# Patient Record
Sex: Male | Born: 1950 | Race: White | Marital: Married | Smoking: Never smoker
Health system: Southern US, Community
[De-identification: ages and names within clinical notes are randomized; demographics above are authoritative.]

## PROBLEM LIST (undated history)

## (undated) DIAGNOSIS — Z789 Other specified health status: Secondary | ICD-10-CM

---

## 2004-04-29 HISTORY — PX: INCISION AND DRAINAGE OF WOUND: SHX1803

## 2011-09-24 ENCOUNTER — Ambulatory Visit: Payer: Self-pay | Admitting: Internal Medicine

## 2015-01-04 ENCOUNTER — Ambulatory Visit: Payer: 59

## 2015-01-04 DIAGNOSIS — Z23 Encounter for immunization: Secondary | ICD-10-CM

## 2015-01-09 ENCOUNTER — Encounter: Payer: Self-pay | Admitting: Family Medicine

## 2015-01-09 ENCOUNTER — Ambulatory Visit
Admission: RE | Admit: 2015-01-09 | Discharge: 2015-01-09 | Disposition: A | Discharge status: Home / Self Care | Payer: 59 | Source: Ambulatory Visit | Attending: Family Medicine | Admitting: Family Medicine

## 2015-01-09 ENCOUNTER — Ambulatory Visit (INDEPENDENT_AMBULATORY_CARE_PROVIDER_SITE_OTHER): Payer: 59 | Admitting: Family Medicine

## 2015-01-09 VITALS — BP 118/78 | HR 79 | Temp 98.1°F | Resp 16 | Ht 62.0 in | Wt 177.0 lb

## 2015-01-09 DIAGNOSIS — M5090 Cervical disc disorder, unspecified, unspecified cervical region: Secondary | ICD-10-CM

## 2015-01-09 DIAGNOSIS — M62838 Other muscle spasm: Secondary | ICD-10-CM

## 2015-01-09 DIAGNOSIS — M6249 Contracture of muscle, multiple sites: Secondary | ICD-10-CM | POA: Diagnosis not present

## 2015-01-09 MED ORDER — MELOXICAM 15 MG PO TABS: 15.0000 mg | ORAL_TABLET | Freq: Every day | ORAL | Status: DC

## 2015-01-09 MED ORDER — CYCLOBENZAPRINE HCL 5 MG PO TABS: 5.0000 mg | ORAL_TABLET | Freq: Two times a day (BID) | ORAL | Status: DC | PRN

## 2015-01-10 LAB — COMPREHENSIVE METABOLIC PANEL
ALBUMIN: 4.9 g/dL — AB (ref 3.6–4.8)
ALT: 19 IU/L (ref 0–44)
AST: 25 IU/L (ref 0–40)
Albumin/Globulin Ratio: 1.8 (ref 1.1–2.5)
Alkaline Phosphatase: 73 IU/L (ref 39–117)
BUN / CREAT RATIO: 13 (ref 10–22)
BUN: 12 mg/dL (ref 8–27)
Bilirubin Total: 0.6 mg/dL (ref 0.0–1.2)
CALCIUM: 9.9 mg/dL (ref 8.6–10.2)
CO2: 25 mmol/L (ref 18–29)
CREATININE: 0.9 mg/dL (ref 0.76–1.27)
Chloride: 99 mmol/L (ref 97–108)
GFR, EST AFRICAN AMERICAN: 105 mL/min/{1.73_m2} (ref >59)
GFR, EST NON AFRICAN AMERICAN: 91 mL/min/{1.73_m2} (ref >59)
GLOBULIN, TOTAL: 2.8 g/dL (ref 1.5–4.5)
Glucose: 90 mg/dL (ref 65–99)
Potassium: 4.5 mmol/L (ref 3.5–5.2)
SODIUM: 140 mmol/L (ref 134–144)
TOTAL PROTEIN: 7.7 g/dL (ref 6.0–8.5)

## 2015-01-13 NOTE — Patient Instructions (Signed)
Consider adding gabapentin to his regimen and return visit
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
## 2015-01-17 ENCOUNTER — Ambulatory Visit: Payer: 59 | Admitting: Physical Therapy

## 2015-01-18 ENCOUNTER — Encounter: Payer: Self-pay | Admitting: Family Medicine

## 2015-01-23 ENCOUNTER — Ambulatory Visit: Payer: 59 | Attending: Family Medicine | Admitting: Physical Therapy

## 2015-01-23 DIAGNOSIS — M542 Cervicalgia: Secondary | ICD-10-CM

## 2015-01-23 NOTE — Patient Instructions (Signed)
All exercises provided were adapted from [email or web address]. Patient was provided a written handout with pictures as described. Any additional cues were manually entered in to handout and copied in to this document.  Seated Thoracic Extension  Sit with your back against the top of a chair or with foam roll/noodle behind and perpendicular to your spine. Support your neck with your hands and tuck elbows forward and together. Gently arch back to mobilize spine at level where chair contacts spine. Hold 1-2 seconds then slowly return. Repeat. Repeat at 3 separate levels throughout mid-back, starting down near bottom of ribs and moving up toward top of shoulder blades.    CERVICAL FLEXION  Tilt your head downwards, then return back to looking straight ahead.   Neck - Levator Scapula Stretch  Sit in a chair and hold onto the underside of the seat with one hand Rotate your head to the opposite side of the hand that is under your chair Take your opposite hand and pull your head and nose toward your arm pit
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
## 2015-01-24 ENCOUNTER — Encounter: Payer: 59 | Admitting: Physical Therapy

## 2015-01-27 ENCOUNTER — Ambulatory Visit: Payer: 59 | Admitting: Physical Therapy

## 2015-01-27 DIAGNOSIS — M542 Cervicalgia: Secondary | ICD-10-CM
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
## 2015-01-30 ENCOUNTER — Ambulatory Visit: Payer: 59 | Admitting: Physical Therapy

## 2015-02-01 ENCOUNTER — Encounter: Payer: 59 | Admitting: Physical Therapy

## 2015-02-03 ENCOUNTER — Ambulatory Visit: Payer: 59 | Admitting: Physical Therapy

## 2015-02-03 ENCOUNTER — Encounter: Payer: 59 | Admitting: Physical Therapy

## 2015-02-06 ENCOUNTER — Ambulatory Visit: Payer: 59 | Attending: Family Medicine | Admitting: Physical Therapy

## 2015-02-07 ENCOUNTER — Ambulatory Visit: Payer: 59 | Admitting: Family Medicine

## 2015-02-13 ENCOUNTER — Ambulatory Visit: Payer: 59 | Admitting: Physical Therapy

## 2015-02-17 ENCOUNTER — Encounter: Payer: 59 | Admitting: Physical Therapy

## 2015-02-20 ENCOUNTER — Emergency Department
Admission: EM | Admit: 2015-02-20 | Discharge: 2015-02-20 | Disposition: A | Discharge status: Home / Self Care | Payer: 59 | Attending: Emergency Medicine | Admitting: Emergency Medicine

## 2015-02-20 ENCOUNTER — Encounter: Payer: Self-pay | Admitting: Emergency Medicine

## 2015-02-20 ENCOUNTER — Emergency Department: Payer: 59

## 2015-02-20 ENCOUNTER — Encounter: Payer: 59 | Admitting: Physical Therapy

## 2015-02-20 DIAGNOSIS — L0291 Cutaneous abscess, unspecified: Secondary | ICD-10-CM

## 2015-02-20 DIAGNOSIS — L03115 Cellulitis of right lower limb: Secondary | ICD-10-CM | POA: Diagnosis not present

## 2015-02-20 DIAGNOSIS — L02611 Cutaneous abscess of right foot: Secondary | ICD-10-CM | POA: Insufficient documentation

## 2015-02-20 DIAGNOSIS — M79671 Pain in right foot: Secondary | ICD-10-CM | POA: Diagnosis present

## 2015-02-20 LAB — COMPREHENSIVE METABOLIC PANEL
ALT: 20 U/L (ref 17–63)
AST: 27 U/L (ref 15–41)
Albumin: 4.5 g/dL (ref 3.5–5.0)
Alkaline Phosphatase: 77 U/L (ref 38–126)
Anion gap: 7 (ref 5–15)
BUN: 14 mg/dL (ref 6–20)
CHLORIDE: 106 mmol/L (ref 101–111)
CO2: 25 mmol/L (ref 22–32)
Calcium: 9.9 mg/dL (ref 8.9–10.3)
Creatinine, Ser: 1.09 mg/dL (ref 0.61–1.24)
Glucose, Bld: 120 mg/dL — ABNORMAL HIGH (ref 65–99)
POTASSIUM: 3.9 mmol/L (ref 3.5–5.1)
SODIUM: 138 mmol/L (ref 135–145)
Total Bilirubin: 0.8 mg/dL (ref 0.3–1.2)
Total Protein: 8.2 g/dL — ABNORMAL HIGH (ref 6.5–8.1)

## 2015-02-20 LAB — CBC WITH DIFFERENTIAL/PLATELET
BASOS ABS: 0 10*3/uL (ref 0–0.1)
Basophils Relative: 0 %
EOS ABS: 0.1 10*3/uL (ref 0–0.7)
EOS PCT: 2 %
HCT: 42.6 % (ref 40.0–52.0)
Hemoglobin: 14.4 g/dL (ref 13.0–18.0)
LYMPHS ABS: 1.1 10*3/uL (ref 1.0–3.6)
LYMPHS PCT: 13 %
MCH: 30.7 pg (ref 26.0–34.0)
MCHC: 33.8 g/dL (ref 32.0–36.0)
MCV: 90.9 fL (ref 80.0–100.0)
MONO ABS: 0.8 10*3/uL (ref 0.2–1.0)
Monocytes Relative: 9 %
Neutro Abs: 6.6 10*3/uL — ABNORMAL HIGH (ref 1.4–6.5)
Neutrophils Relative %: 76 %
PLATELETS: 202 10*3/uL (ref 150–440)
RBC: 4.69 MIL/uL (ref 4.40–5.90)
RDW: 12.3 % (ref 11.5–14.5)
WBC: 8.7 10*3/uL (ref 3.8–10.6)

## 2015-02-20 MED ORDER — MORPHINE SULFATE (PF) 4 MG/ML IV SOLN
4.0000 mg | Freq: Once | INTRAVENOUS | Status: AC
  Administered 2015-02-20: 4 mg via INTRAVENOUS
  Filled 2015-02-20: qty 1

## 2015-02-20 MED ORDER — PIPERACILLIN-TAZOBACTAM 3.375 G IVPB
3.3750 g | Freq: Once | INTRAVENOUS | Status: AC
  Administered 2015-02-20: 3.375 g via INTRAVENOUS
  Filled 2015-02-20: qty 50

## 2015-02-20 MED ORDER — OXYCODONE-ACETAMINOPHEN 5-325 MG PO TABS: 2.0000 | ORAL_TABLET | Freq: Four times a day (QID) | ORAL | Status: DC | PRN

## 2015-02-20 MED ORDER — IOHEXOL 300 MG/ML  SOLN
100.0000 mL | Freq: Once | INTRAMUSCULAR | Status: AC | PRN
  Administered 2015-02-20: 100 mL via INTRAVENOUS

## 2015-02-20 MED ORDER — SULFAMETHOXAZOLE-TRIMETHOPRIM 800-160 MG PO TABS: 2.0000 | ORAL_TABLET | Freq: Two times a day (BID) | ORAL | Status: DC

## 2015-02-20 MED ORDER — VANCOMYCIN HCL IN DEXTROSE 1-5 GM/200ML-% IV SOLN
1000.0000 mg | Freq: Once | INTRAVENOUS | Status: AC
  Administered 2015-02-20: 1000 mg via INTRAVENOUS
  Filled 2015-02-20: qty 200

## 2015-02-20 MED ORDER — LIDOCAINE-EPINEPHRINE 1 %-1:100000 IJ SOLN
20.0000 mL | Freq: Once | INTRAMUSCULAR | Status: AC
  Administered 2015-02-20: 5 mL
  Filled 2015-02-20: qty 20

## 2015-02-20 NOTE — Discharge Instructions (Signed)
Abscess °An abscess is an infected area that contains a collection of pus and debris. It can occur in almost any part of the body. An abscess is also known as a furuncle or boil. °CAUSES  °An abscess occurs when tissue gets infected. This can occur from blockage of oil or sweat glands, infection of hair follicles, or a minor injury to the skin. As the body tries to fight the infection, pus collects in the area and creates pressure under the skin. This pressure causes pain. People with weakened immune systems have difficulty fighting infections and get certain abscesses more often.  °SYMPTOMS °Usually an abscess develops on the skin and becomes a painful mass that is red, warm, and tender. If the abscess forms under the skin, you may feel a moveable soft area under the skin. Some abscesses break open (rupture) on their own, but most will continue to get worse without care. The infection can spread deeper into the body and eventually into the bloodstream, causing you to feel ill.  °DIAGNOSIS  °Your caregiver will take your medical history and perform a physical exam. A sample of fluid may also be taken from the abscess to determine what is causing your infection. °TREATMENT  °Your caregiver may prescribe antibiotic medicines to fight the infection. However, taking antibiotics alone usually does not cure an abscess. Your caregiver may need to make a small cut (incision) in the abscess to drain the pus. In some cases, gauze is packed into the abscess to reduce pain and to continue draining the area. °HOME CARE INSTRUCTIONS  °· Only take over-the-counter or prescription medicines for pain, discomfort, or fever as directed by your caregiver. °· If you were prescribed antibiotics, take them as directed. Finish them even if you start to feel better. °· If gauze is used, follow your caregiver's directions for changing the gauze. °· To avoid spreading the infection: °· Keep your draining abscess covered with a  bandage. °· Wash your hands well. °· Do not share personal care items, towels, or whirlpools with others. °· Avoid skin contact with others. °· Keep your skin and clothes clean around the abscess. °· Keep all follow-up appointments as directed by your caregiver. °SEEK MEDICAL CARE IF:  °· You have increased pain, swelling, redness, fluid drainage, or bleeding. °· You have muscle aches, chills, or a general ill feeling. °· You have a fever. °MAKE SURE YOU:  °· Understand these instructions. °· Will watch your condition. °· Will get help right away if you are not doing well or get worse. °  °This information is not intended to replace advice given to you by your health care provider. Make sure you discuss any questions you have with your health care provider. °  °Document Released: 01/23/2005 Document Revised: 10/15/2011 Document Reviewed: 06/28/2011 °Elsevier Interactive Patient Education ©2016 [employer] ° °Incision and Drainage °Incision and drainage is a procedure in which a sac-like structure (cystic structure) is opened and drained. The area to be drained usually contains material such as pus, fluid, or blood.  °LET YOUR CAREGIVER KNOW ABOUT:  °· Allergies to medicine. °· Medicines taken, including vitamins, herbs, eyedrops, over-the-counter medicines, and creams. °· Use of steroids (by mouth or creams). °· Previous problems with anesthetics or numbing medicines. °· History of bleeding problems or blood clots. °· Previous surgery. °· Other health problems, including diabetes and kidney problems. °· Possibility of pregnancy, if this applies. °RISKS AND COMPLICATIONS °· Pain. °· Bleeding. °· Scarring. °· Infection. °BEFORE THE PROCEDURE  °  You may need to have an ultrasound or other imaging tests to see how large or deep your cystic structure is. Blood tests may also be used to determine if you have an infection or how severe the infection is. You may need to have a tetanus shot. °PROCEDURE  °The affected area  is cleaned with a cleaning fluid. The cyst area will then be numbed with a medicine (local anesthetic). A small incision will be made in the cystic structure. A syringe or catheter may be used to drain the contents of the cystic structure, or the contents may be squeezed out. The area will then be flushed with a cleansing solution. After cleansing the area, it is often gently packed with a gauze or another wound dressing. Once it is packed, it will be covered with gauze and tape or some other type of wound dressing.  °AFTER THE PROCEDURE  °· Often, you will be allowed to go home right after the procedure. °· You may be given antibiotic medicine to prevent or heal an infection. °· If the area was packed with gauze or some other wound dressing, you will likely need to come back in 1 to 2 days to get it removed. °· The area should heal in about 14 days. °  °This information is not intended to replace advice given to you by your health care provider. Make sure you discuss any questions you have with your health care provider. °  °Document Released: 10/09/2000 Document Revised: 10/15/2011 Document Reviewed: 06/10/2011 °Elsevier Interactive Patient Education ©2016 [employer] ° °

## 2015-02-20 NOTE — ED Provider Notes (Signed)
[facility] Emergency Department Provider Note     Time seen: ----------------------------------------- 9:27 AM on 02/20/2015 -----------------------------------------   I have reviewed the triage vital signs and the nursing notes.   HISTORY  Chief Complaint Foot Pain    HPI Tanner [name] is a 64 y.o. male who presents ER for right foot infection. Over the last week he had injured the right third digit on his foot in the area became red and swollen. [facility] and was told it was cellulitis. He is placed on antibiotics but he is not sure which. Since that period time he said increased pain and swelling around the foot particularly the right third digit with some drainage. 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   History reviewed. No pertinent past medical history.  There are no active problems to display for this patient.   History reviewed. No pertinent past surgical history.  Allergies Review of patient's allergies indicates no known allergies.  Social History Social History  Substance Use Topics  . Smoking status: Never Smoker   . Smokeless tobacco: None  . Alcohol Use: 0.0 oz/week    0 Standard drinks or equivalent per week    Review of Systems Constitutional: Negative for fever. Eyes: Negative for visual changes. ENT: Negative for sore throat. Cardiovascular: Negative for chest pain. Respiratory: Negative for shortness of breath. Gastrointestinal: Negative for abdominal pain, vomiting and diarrhea. Genitourinary: Negative for dysuria. Musculoskeletal: Negative for back pain. Skin: Positive for redness swelling and drainage around the right third digit Neurological: Negative for headaches, focal weakness or numbness.  10-point ROS otherwise negative.  ____________________________________________   PHYSICAL EXAM:  VITAL SIGNS: ED Triage Vitals  Enc Vitals Group     BP 02/20/15 0912 153/98 mmHg     Pulse  Rate 02/20/15 0912 88     Resp 02/20/15 0912 20     Temp 02/20/15 0912 97.7 F (36.5 C)     Temp Source 02/20/15 0912 Oral     SpO2 02/20/15 0912 99 %     Weight 02/20/15 0912 175 lb (79.379 kg)     Height 02/20/15 0912 5\' 3"  (1.6 m)     Head Cir --      Peak Flow --      Pain Score 02/20/15 0913 4     Pain Loc --      Pain Edu? --      Excl. in GC? --     Constitutional: Alert and oriented. Well appearing and in no distress. Eyes: Conjunctivae are normal. PERRL. Normal extraocular movements. ENT   Head: Normocephalic and atraumatic.   Nose: No congestion/rhinnorhea.   Mouth/Throat: Mucous membranes are moist.   Neck: No stridor. Cardiovascular: Normal rate, regular rhythm. Normal and symmetric distal pulses are present in all extremities. No murmurs, rubs, or gallops. Respiratory: Normal respiratory effort without tachypnea nor retractions. Breath sounds are clear and equal bilaterally. No wheezes/rales/rhonchi. Musculoskeletal: Market swelling and erythema noted around and of the right third digit on his foot. There is also erythema over the plantar aspect distally. There is apparent deep abscess formation in this area. Neurologic:  Normal speech and language. No gross focal neurologic deficits are appreciated. Speech is normal. No gait instability. Skin: Market erythema around the right third digit on his foot consistent with cellulitis and abscess formation  Psychiatric: Mood and affect are normal. Speech and behavior are normal. Patient exhibits appropriate insight and judgment. ___________________________________________  ED  COURSE:  Pertinent labs & imaging results that were available during my care of the patient were reviewed by me and considered in my medical decision making (see chart for details). Patient is in no acute distress, will need incision and drainage of this area. He'll also receive IV vancomycin and  Zosyn. ____________________________________________    LABS (pertinent positives/negatives)  Labs Reviewed  CBC WITH DIFFERENTIAL/PLATELET - Abnormal; Notable for the following:    Neutro Abs 6.6 (*)    All other components within normal limits  COMPREHENSIVE METABOLIC PANEL - Abnormal; Notable for the following:    Glucose, Bld 120 (*)    Total Protein 8.2 (*)    All other components within normal limits  WOUND CULTURE    RADIOLOGY Images were viewed by me  CT right foot IMPRESSION: 1. Cellulitis of the right forefoot with a 7.6 x 18 x 30 mm abscess in the webspace between the third and fourth toes extending into the plantar aspect and into the third phalanx laterally.  INCISION AND DRAINAGE Performed by: Daryel November E Consent: Verbal consent obtained. Risks and benefits: risks, benefits and alternatives were discussed Type: abscess  Body area: Right foot  Anesthesia: local infiltration  Incision was made with a scalpel.  Local anesthetic: lidocaine 1 % with epinephrine  Anesthetic total: 5 ml  Complexity: complex Blunt dissection to break up loculations  Drainage: purulent  Drainage amount: Moderate   Packing material: 1/4 in iodoform gauze  Patient tolerance: Patient tolerated the procedure well with no immediate complications.   ____________________________________________  FINAL ASSESSMENT AND PLAN  Right foot abscess and cellulitis  Plan: Patient with labs and imaging as dictated above. Foot is undergone incision and drainage. I discussed CT scan with Dr. Alberteen Spindle who will follow-up with him as an outpatient. I will double his Septra to 2 twice a day as well as given Percocet for pain.   Emily Filbert, MD   Emily Filbert, MD 02/20/15 [phone]

## 2015-02-20 NOTE — ED Notes (Signed)
rigtht foot infection.  Told at [facility] it was celulitis

## 2015-02-23 LAB — WOUND CULTURE: Special Requests: NORMAL

## 2015-02-24 ENCOUNTER — Encounter: Payer: Self-pay | Admitting: *Deleted

## 2015-02-24 ENCOUNTER — Encounter: Payer: 59 | Admitting: Physical Therapy

## 2015-02-24 ENCOUNTER — Ambulatory Visit: Payer: 59 | Admitting: Registered Nurse

## 2015-02-24 ENCOUNTER — Encounter
Admission: RE | Disposition: A | Discharge status: Home / Self Care | Payer: Self-pay | Source: Ambulatory Visit | Attending: Podiatry

## 2015-02-24 ENCOUNTER — Ambulatory Visit
Admission: RE | Admit: 2015-02-24 | Discharge: 2015-02-24 | Disposition: A | Discharge status: Home / Self Care | Payer: 59 | Source: Ambulatory Visit | Attending: Podiatry | Admitting: Podiatry

## 2015-02-24 DIAGNOSIS — L02611 Cutaneous abscess of right foot: Secondary | ICD-10-CM | POA: Diagnosis present

## 2015-02-24 DIAGNOSIS — K219 Gastro-esophageal reflux disease without esophagitis: Secondary | ICD-10-CM | POA: Diagnosis not present

## 2015-02-24 HISTORY — DX: Other specified health status: Z78.9

## 2015-02-24 HISTORY — PX: IRRIGATION AND DEBRIDEMENT FOOT: SHX6602

## 2015-02-24 SURGERY — IRRIGATION AND DEBRIDEMENT FOOT
Anesthesia: General | Laterality: Right | Wound class: Dirty or Infected

## 2015-02-24 MED ORDER — FAMOTIDINE 20 MG PO TABS
20.0000 mg | ORAL_TABLET | Freq: Once | ORAL | Status: AC
Start: 2015-02-24 — End: 2015-02-24
  Administered 2015-02-24: 20 mg via ORAL

## 2015-02-24 MED ORDER — LIDOCAINE HCL 1 % IJ SOLN
INTRAMUSCULAR | Status: DC | PRN
  Administered 2015-02-24: 3 mL

## 2015-02-24 MED ORDER — ONDANSETRON HCL 4 MG PO TABS: 4.0000 mg | ORAL_TABLET | Freq: Four times a day (QID) | ORAL | Status: DC | PRN

## 2015-02-24 MED ORDER — HYDROCODONE-ACETAMINOPHEN 5-325 MG PO TABS: 1.0000 | ORAL_TABLET | Freq: Four times a day (QID) | ORAL | Status: DC | PRN

## 2015-02-24 MED ORDER — LIDOCAINE-EPINEPHRINE 1 %-1:100000 IJ SOLN
INTRAMUSCULAR | Status: AC
  Filled 2015-02-24: qty 1

## 2015-02-24 MED ORDER — LACTATED RINGERS IV SOLN
INTRAVENOUS | Status: DC
  Administered 2015-02-24: 12:00:00 via INTRAVENOUS

## 2015-02-24 MED ORDER — BUPIVACAINE HCL (PF) 0.5 % IJ SOLN
INTRAMUSCULAR | Status: AC
  Filled 2015-02-24: qty 30

## 2015-02-24 MED ORDER — LIDOCAINE HCL (PF) 1 % IJ SOLN
INTRAMUSCULAR | Status: AC
  Filled 2015-02-24: qty 30

## 2015-02-24 MED ORDER — VANCOMYCIN HCL IN DEXTROSE 1-5 GM/200ML-% IV SOLN
1000.0000 mg | Freq: Once | INTRAVENOUS | Status: AC
  Administered 2015-02-24: 1000 mg via INTRAVENOUS
  Filled 2015-02-24: qty 200

## 2015-02-24 MED ORDER — BUPIVACAINE HCL 0.5 % IJ SOLN
INTRAMUSCULAR | Status: DC | PRN
  Administered 2015-02-24: 3 mL

## 2015-02-24 MED ORDER — ONDANSETRON HCL 4 MG/2ML IJ SOLN: 4.0000 mg | Freq: Four times a day (QID) | INTRAMUSCULAR | Status: DC | PRN

## 2015-02-24 MED ORDER — LIDOCAINE-EPINEPHRINE 1 %-1:100000 IJ SOLN
INTRAMUSCULAR | Status: DC | PRN
  Administered 2015-02-24: 2 mL

## 2015-02-24 MED ORDER — PROPOFOL 10 MG/ML IV BOLUS
INTRAVENOUS | Status: DC | PRN
  Administered 2015-02-24: 150 mg via INTRAVENOUS
  Administered 2015-02-24: 50 mg via INTRAVENOUS

## 2015-02-24 MED ORDER — GLYCOPYRROLATE 0.2 MG/ML IJ SOLN
INTRAMUSCULAR | Status: DC | PRN
  Administered 2015-02-24: 0.2 mg via INTRAVENOUS

## 2015-02-24 MED ORDER — DEXAMETHASONE SODIUM PHOSPHATE 4 MG/ML IJ SOLN
INTRAMUSCULAR | Status: DC | PRN
  Administered 2015-02-24: 5 mg via INTRAVENOUS

## 2015-02-24 MED ORDER — FENTANYL CITRATE (PF) 100 MCG/2ML IJ SOLN
INTRAMUSCULAR | Status: DC | PRN
  Administered 2015-02-24 (×2): 50 ug via INTRAVENOUS

## 2015-02-24 MED ORDER — FAMOTIDINE 20 MG PO TABS
ORAL_TABLET | ORAL | Status: AC
  Filled 2015-02-24: qty 1

## 2015-02-24 MED ORDER — LIDOCAINE HCL (CARDIAC) 20 MG/ML IV SOLN
INTRAVENOUS | Status: DC | PRN
  Administered 2015-02-24: 80 mg via INTRAVENOUS

## 2015-02-24 MED ORDER — VANCOMYCIN HCL IN DEXTROSE 1-5 GM/200ML-% IV SOLN
INTRAVENOUS | Status: AC
  Administered 2015-02-24: 1000 mg via INTRAVENOUS
  Filled 2015-02-24: qty 200

## 2015-02-24 MED ORDER — MIDAZOLAM HCL 2 MG/2ML IJ SOLN
INTRAMUSCULAR | Status: DC | PRN
  Administered 2015-02-24: 2 mg via INTRAVENOUS

## 2015-02-24 MED ORDER — HYDROCODONE-ACETAMINOPHEN 5-325 MG PO TABS: 1.0000 | ORAL_TABLET | ORAL | Status: DC | PRN

## 2015-02-24 MED ORDER — ONDANSETRON HCL 4 MG/2ML IJ SOLN
INTRAMUSCULAR | Status: DC | PRN
  Administered 2015-02-24: 4 mg via INTRAVENOUS

## 2015-02-24 SURGICAL SUPPLY — 54 items
BANDAGE ELASTIC 4 CLIP ST LF (GAUZE/BANDAGES/DRESSINGS) IMPLANT
BANDAGE STRETCH 3X4.1 STRL (GAUZE/BANDAGES/DRESSINGS) ×2 IMPLANT
BLADE OSC/SAGITTAL MD 5.5X18 (BLADE) IMPLANT
BLADE OSCILLATING/SAGITTAL (BLADE)
BLADE SURG 15 STRL LF DISP TIS (BLADE) IMPLANT
BLADE SURG 15 STRL SS (BLADE)
BLADE SW THK.38XMED LNG THN (BLADE) IMPLANT
BNDG COHESIVE 4X5 TAN STRL (GAUZE/BANDAGES/DRESSINGS) ×2 IMPLANT
BNDG COHESIVE 6X5 TAN STRL LF (GAUZE/BANDAGES/DRESSINGS) ×2 IMPLANT
BNDG ESMARK 4X12 TAN STRL LF (GAUZE/BANDAGES/DRESSINGS) ×2 IMPLANT
BNDG GAUZE 4.5X4.1 6PLY STRL (MISCELLANEOUS) ×2 IMPLANT
CANISTER SUCT 1200ML W/VALVE (MISCELLANEOUS) ×2 IMPLANT
CANISTER SUCT 3000ML (MISCELLANEOUS) ×2 IMPLANT
CUFF TOURN 18 STER (MISCELLANEOUS) ×2 IMPLANT
CUFF TOURN DUAL PL 12 NO SLV (MISCELLANEOUS) IMPLANT
DRAPE FLUOR MINI C-ARM 54X84 (DRAPES) ×2 IMPLANT
DRAPE XRAY CASSETTE 23X24 (DRAPES) ×2 IMPLANT
DURAPREP 26ML APPLICATOR (WOUND CARE) IMPLANT
GAUZE PACKING 1/4X5YD (GAUZE/BANDAGES/DRESSINGS) IMPLANT
GAUZE PACKING IODOFORM 1X5 (MISCELLANEOUS) ×2 IMPLANT
GAUZE PETRO XEROFOAM 1X8 (MISCELLANEOUS) ×2 IMPLANT
GAUZE SPONGE 4X4 12PLY STRL (GAUZE/BANDAGES/DRESSINGS) ×2 IMPLANT
GAUZE STRETCH 2X75IN STRL (MISCELLANEOUS) ×2 IMPLANT
GLOVE BIO SURGEON STRL SZ7.5 (GLOVE) ×2 IMPLANT
GLOVE INDICATOR 8.0 STRL GRN (GLOVE) ×2 IMPLANT
GOWN STRL REUS W/TWL MED LVL3 (GOWN DISPOSABLE) ×4 IMPLANT
HANDPIECE SUCTION TUBG SURGILV (MISCELLANEOUS) ×2 IMPLANT
HANDPIECE VERSAJET DEBRIDEMENT (MISCELLANEOUS) IMPLANT
IV NS 1000ML (IV SOLUTION)
IV NS 1000ML BAXH (IV SOLUTION) IMPLANT
KIT RM TURNOVER STRD PROC AR (KITS) ×2 IMPLANT
LABEL OR SOLS (LABEL) ×2 IMPLANT
NEEDLE FILTER BLUNT 18X 1/2SAF (NEEDLE) ×1
NEEDLE FILTER BLUNT 18X1 1/2 (NEEDLE) ×1 IMPLANT
NEEDLE HYPO 25X1 1.5 SAFETY (NEEDLE) ×2 IMPLANT
NS IRRIG 500ML POUR BTL (IV SOLUTION) ×2 IMPLANT
PACK EXTREMITY ARMC (MISCELLANEOUS) ×2 IMPLANT
PAD ABD DERMACEA PRESS 5X9 (GAUZE/BANDAGES/DRESSINGS) ×2 IMPLANT
PAD GROUND ADULT SPLIT (MISCELLANEOUS) ×2 IMPLANT
PENCIL ELECTRO HAND CTR (MISCELLANEOUS) ×2 IMPLANT
RASP SM TEAR CROSS CUT (RASP) IMPLANT
SOL .9 NS 3000ML IRR  AL (IV SOLUTION) ×1
SOL .9 NS 3000ML IRR UROMATIC (IV SOLUTION) ×1 IMPLANT
STOCKINETTE IMPERVIOUS 9X36 MD (GAUZE/BANDAGES/DRESSINGS) ×2 IMPLANT
SUT ETHILON 2 0 FS 18 (SUTURE) IMPLANT
SUT ETHILON 4-0 (SUTURE) ×1
SUT ETHILON 4-0 FS2 18XMFL BLK (SUTURE) ×1
SUT VIC AB 3-0 SH 27 (SUTURE)
SUT VIC AB 3-0 SH 27X BRD (SUTURE) IMPLANT
SUT VIC AB 4-0 FS2 27 (SUTURE) IMPLANT
SUTURE ETHLN 4-0 FS2 18XMF BLK (SUTURE) ×1 IMPLANT
SWAB CULTURE AMIES ANAERIB BLU (MISCELLANEOUS) ×2 IMPLANT
SYR 3ML LL SCALE MARK (SYRINGE) ×2 IMPLANT
SYRINGE 10CC LL (SYRINGE) ×4 IMPLANT

## 2015-02-24 NOTE — Anesthesia Postprocedure Evaluation (Signed)
  Anesthesia Post-op Note  Patient: Tanner [name]  Procedure(s) Performed: Procedure(s): IRRIGATION AND DEBRIDEMENT FOOT (Right)  Anesthesia type:General  Patient location: PACU  Post pain: Pain level controlled  Post assessment: Post-op Vital signs reviewed, Patient's Cardiovascular Status Stable, Respiratory Function Stable, Patent Airway and No signs of Nausea or vomiting  Post vital signs: Reviewed and stable  Last Vitals:  Filed Vitals:   02/24/15 1504  BP: 113/80  Pulse: 66  Temp:   Resp: 11    Level of consciousness: awake, alert  and patient cooperative  Complications: No apparent anesthesia complications

## 2015-02-24 NOTE — Discharge Instructions (Addendum)
 [facility]  POST OPERATIVE INSTRUCTIONS FOR DR. TROXLER AND DR. Genev[facility]   1. Take your medication as prescribed.  Pain medication should be taken only as needed.  2. Keep the dressing clean, dry and intact.  3. Keep your foot elevated above the heart level for the first 48 hours.  4. Walking to the bathroom and brief periods of walking are acceptable, unless we have instructed you to be non-weight bearing.  5. Always wear your post-op shoe when walking.  Always use your crutches if you are to be non-weight bearing.  6. Do not take a shower. Baths are permissible as long as the foot is kept out of the water.   7. Every hour you are awake:  - Bend your knee 15 times. - Flex foot 15 times - Massage calf 15 times  8. Call [facility] [phone]) if any of the following problems occur: - You develop a temperature or fever. - The bandage becomes saturated with blood. - Medication does not stop your pain. - Injury of the foot occurs. - Any symptoms of infection including redness, odor, or red streaks running from wound.      AMBULATORY SURGERY  DISCHARGE INSTRUCTIONS   The drugs that you were given will stay in your system until tomorrow so for the next 24 hours you should not:  Drive an automobile Make any legal decisions Drink any alcoholic beverage   You may resume regular meals tomorrow.  Today it is better to start with liquids and gradually work up to solid foods.  You may eat anything you prefer, but it is better to start with liquids, then soup and crackers, and gradually work up to solid foods.   Please notify your doctor immediately if you have any unusual bleeding, trouble breathing, redness and pain at the surgery site, drainage, fever, or pain not relieved by medication.   Your post-operative visit with Dr.                                     is: Date:                         Time:    Please call to schedule your post-operative visit.  Additional Instructions:

## 2015-02-24 NOTE — Op Note (Signed)
Operative note   Surgeon:Darina Hartwell Armed forces logistics/support/administrative officerowler    Assistant: None    Preop diagnosis: Abscess right foot    Postop diagnosis: Same    Procedure: Incision and drainage abscess right third toe and joint    EBL: Minimal    Anesthesia:local and general    Hemostasis: None    Specimen: Wound culture    Complications: None    Operative indications: 64 year old gentleman seen in the outpatient clinic yesterday with abscess to his right foot in his third web space. We discussed I&D of the area and he consented to this.    Procedure:  Patient was brought into the OR and placed on the operating table in thesupine position. After anesthesia was obtained theright lower extremity was prepped and draped in usual sterile fashion.  Attention was directed to the lateral aspect of the right third toe where an abscessed area was noted. At this time a longitudinal incision was made from the PIP J to the MTPJ along this area. Blunt dissection carried down underneath the third toe and to the MTPJ and webspace region. A small amount purulent drainage was noted in the area. Further dissection was carried proximal and distal with no further signs of purulence. This was then flushed with 500 mL's of saline with bulb syringe. The proximal and distal aspect of the incision site was then closed with a 4-0 nylon. Central area was packed open with Iodosorb gauze dressing.    Patient tolerated the procedure and anesthesia well.  Was transported from the OR to the PACU with all vital signs stable and vascular status intact. To be discharged per routine protocol.  Will follow up in approximately 1 week in the outpatient clinic.

## 2015-02-24 NOTE — Anesthesia Procedure Notes (Signed)
Procedure Name: LMA Insertion Date/Time: 02/24/2015 2:01 PM Performed by: Michaele OfferSAVAGE, Deklyn Gibbon Pre-anesthesia Checklist: Patient identified, Emergency Drugs available, Suction available, Patient being monitored and Timeout performed Patient Re-evaluated:Patient Re-evaluated prior to inductionOxygen Delivery Method: Circle system utilized Preoxygenation: Pre-oxygenation with 100% oxygen Intubation Type: IV induction Ventilation: Mask ventilation without difficulty LMA: LMA inserted LMA Size: 4.5 Number of attempts: 1 Placement Confirmation: positive ETCO2 and breath sounds checked- equal and bilateral Tube secured with: Tape Dental Injury: Teeth and Oropharynx as per pre-operative assessment

## 2015-02-24 NOTE — H&P (Signed)
  HISTORY AND PHYSICAL INTERVAL NOTE:  02/24/2015  1:12 PM  Tanner [name]  has presented today for surgery, with the diagnosis of CELLULITIS AND ABSCESS OF RIGHT FOOT.  The various methods of treatment have been discussed with the patient.  No guarantees were given.  After consideration of risks, benefits and other options for treatment, the patient has consented to surgery.  I have reviewed the patients' chart and labs.    Patient Vitals for the past 24 hrs:  BP Temp Temp src Pulse Resp SpO2  02/24/15 1137 127/87 mmHg 98.5 F (36.9 C) Oral (!) 55 16 99 %    [name] history and physical examination was performed in my office.  The patient was reexamined.  There have been no changes to this history and physical examination.  Tanner [name], Tanner [name]

## 2015-02-24 NOTE — Anesthesia Preprocedure Evaluation (Addendum)
Anesthesia Evaluation  Patient identified by MRN, date of birth, ID band Patient awake    Reviewed: Allergy & Precautions, NPO status , Patient's Chart, lab work & pertinent test results  Airway Mallampati: I  TM Distance: >3 FB Neck ROM: Full    Dental  (+) Teeth Intact   Pulmonary neg pulmonary ROS,    breath sounds clear to auscultation       Cardiovascular negative cardio ROS   Rhythm:Regular Rate:Normal     Neuro/Psych negative neurological ROS  negative psych ROS   GI/Hepatic negative GI ROS, Neg liver ROS, GERD  Medicated,  Endo/Other  negative endocrine ROS  Renal/GU negative Renal ROS  negative genitourinary   Musculoskeletal negative musculoskeletal ROS (+)   Abdominal (+)  Abdomen: soft.    Peds  Hematology negative hematology ROS (+)   Anesthesia Other Findings   Reproductive/Obstetrics                            Anesthesia Physical Anesthesia Plan  ASA: II  Anesthesia Plan: General   Post-op Pain Management:    Induction:   Airway Management Planned: LMA  Additional Equipment:   Intra-op Plan:   Post-operative Plan:   Informed Consent: I have reviewed the patients History and Physical, chart, labs and discussed the procedure including the risks, benefits and alternatives for the proposed anesthesia with the patient or authorized representative who has indicated his/her understanding and acceptance.     Plan Discussed with: CRNA  Anesthesia Plan Comments:         Anesthesia Quick Evaluation

## 2015-02-24 NOTE — Transfer of Care (Signed)
Immediate Anesthesia Transfer of Care Note  Patient: Tanner [name]  Procedure(s) Performed: Procedure(s): IRRIGATION AND DEBRIDEMENT FOOT (Right)  Patient Location: PACU  Anesthesia Type:General  Level of Consciousness: awake, alert , oriented and patient cooperative  Airway & Oxygen Therapy: Patient Spontanous Breathing and Patient connected to face mask oxygen  Post-op Assessment: Report given to RN, Post -op Vital signs reviewed and stable and Patient moving all extremities X 4  Post vital signs: Reviewed and stable  Last Vitals:  Filed Vitals:   02/24/15 1432  BP:   Pulse: 70  Temp: 36.3 C  Resp: 16    Complications: No apparent anesthesia complications

## 2015-02-27 ENCOUNTER — Encounter: Payer: Self-pay | Admitting: Podiatry

## 2015-02-27 LAB — WOUND CULTURE

## 2015-02-28 LAB — ANAEROBIC CULTURE

## 2015-03-09 ENCOUNTER — Ambulatory Visit: Payer: 59 | Admitting: Family Medicine

## 2015-08-17 ENCOUNTER — Encounter: Payer: Self-pay | Admitting: Family Medicine

## 2015-08-17 ENCOUNTER — Ambulatory Visit (INDEPENDENT_AMBULATORY_CARE_PROVIDER_SITE_OTHER): Payer: 59 | Admitting: Family Medicine

## 2015-08-17 VITALS — BP 138/78 | HR 90 | Temp 98.5°F | Resp 18 | Ht 63.0 in | Wt 181.3 lb

## 2015-08-17 DIAGNOSIS — J0100 Acute maxillary sinusitis, unspecified: Secondary | ICD-10-CM | POA: Diagnosis not present

## 2015-08-17 MED ORDER — FLUTICASONE PROPIONATE 50 MCG/ACT NA SUSP: 2.0000 | Freq: Every day | NASAL | Status: DC

## 2015-08-17 MED ORDER — AZITHROMYCIN 250 MG PO TABS: ORAL_TABLET | ORAL | Status: DC

## 2015-08-17 NOTE — Progress Notes (Signed)
Name: Tanner RidingGene R Sankey   MRN: [id number]    DOB: 02/24/1951   Date:08/17/2015       Progress Note  Subjective  Chief Complaint  Chief Complaint  Patient presents with  . Cough    for 1 week OTC no helping    HPI  Pt. Presents for symptoms of nasal congestion, drainage, productive cough, and wheezing.  Has history of sinus allergies in the spring time.  Has been taking Claritin and Nasacort daily.   Past Medical History  Diagnosis Date  . Medical history non-contributory     Past Surgical History  Procedure Laterality Date  . Incision and drainage of wound Left 2006    left thigh  . Irrigation and debridement foot Right 02/24/2015    Procedure: IRRIGATION AND DEBRIDEMENT FOOT;  Surgeon: Gwyneth RevelsJustin Fowler, DPM;  Location: [facility] ORS;  Service: Podiatry;  Laterality: Right;    No family history on file.  Social History   Social History  . Marital Status: Married    Spouse Name: N/A  . Number of Children: N/A  . Years of Education: N/A   Occupational History  . Not on file.   Social History Main Topics  . Smoking status: Never Smoker   . Smokeless tobacco: Not on file  . Alcohol Use: 0.0 oz/week    0 Standard drinks or equivalent per week     Comment: occassional  . Drug Use: No  . Sexual Activity: Not on file   Other Topics Concern  . Not on file   Social History Narrative     Current outpatient prescriptions:  .  cyclobenzaprine (FLEXERIL) 5 MG tablet, Take 1 tablet (5 mg total) by mouth 2 (two) times daily as needed for muscle spasms. (Patient not taking: Reported on 02/20/2015), Disp: 60 tablet, Rfl: 1 .  HYDROcodone-acetaminophen (NORCO) 5-325 MG tablet, Take 1 tablet by mouth every 6 (six) hours as needed for moderate pain. (Patient not taking: Reported on 08/17/2015), Disp: 30 tablet, Rfl: 0 .  meloxicam (MOBIC) 15 MG tablet, Take 1 tablet (15 mg total) by mouth daily. (Patient not taking: Reported on 02/20/2015), Disp: 30 tablet, Rfl: 1 .   oxyCODONE-acetaminophen (PERCOCET) 5-325 MG tablet, Take 2 tablets by mouth every 6 (six) hours as needed for moderate pain or severe pain. (Patient not taking: Reported on 08/17/2015), Disp: 30 tablet, Rfl: 0 .  sulfamethoxazole-trimethoprim (BACTRIM DS) 800-160 MG tablet, Take 2 tablets by mouth 2 (two) times daily. (Patient not taking: Reported on 08/17/2015), Disp: 40 tablet, Rfl: 0  No Known Allergies   Review of Systems  Constitutional: Positive for fever (elevated temp of 100F). Negative for chills.  HENT: Positive for congestion.   Respiratory: Positive for cough.     Objective  Filed Vitals:   08/17/15 1346  BP: 138/78  Pulse: 90  Temp: 98.5 F (36.9 C)  Resp: 18  Height: 5\' 3"  (1.6 m)  Weight: 181 lb 5 oz (82.243 kg)  SpO2: 97%    Physical Exam  Constitutional: He is oriented to person, place, and time and well-developed, well-nourished, and in no distress.  HENT:  Nose: Right sinus exhibits maxillary sinus tenderness. Left sinus exhibits maxillary sinus tenderness.  Mouth/Throat: Posterior oropharyngeal erythema present.  Nasal turbinates inflamed and hypertrophied  Cardiovascular: Normal rate and regular rhythm.   Pulmonary/Chest: He has wheezes in the left middle field.  Neurological: He is alert and oriented to person, place, and time.  Nursing note and vitals reviewed.    Assessment &  Plan  1. Acute non-recurrent maxillary sinusitis We will start on antibiotic therapy, change Nasacort to Flonase and follow-up if no improvement - azithromycin (ZITHROMAX) 250 MG tablet; 2 tabs po day 1, then 1 tab po q day x 4 days  Dispense: 6 tablet; Refill: 0 - fluticasone (FLONASE) 50 MCG/ACT nasal spray; Place 2 sprays into both nostrils daily.  Dispense: 16 g; Refill: 0   Lakeyta Vandenheuvel Asad A. Fay[facility] 08/17/2015 2:02 PM

## 2016-05-27 IMAGING — CT CT FOOT*R* W/CM
2 of 4 series · 6 of 20 positions shown, 7 images · IV contrast (omnipaque)
Comparison: None.

CLINICAL DATA: Right foot infection.  Evaluate for abscess.

EXAM:
CT OF THE RIGHT FOOT WITH CONTRAST
TECHNIQUE: Multidetector CT imaging was performed following the standard
protocol during bolus administration of intravenous contrast.
CONTRAST:  100mL OMNIPAQUE IOHEXOL 300 MG/ML  SOLN

[Series 13: ax soft tissue · coronal · 0.40mm/px · 3 of 135 slices shown]
[im 62/135  bone]
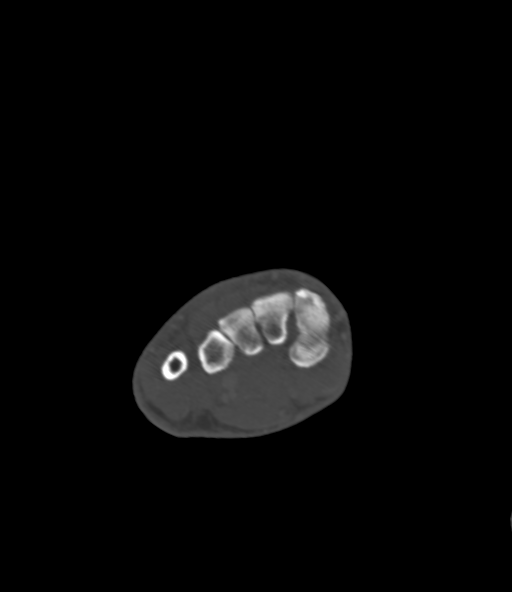
[im 68/135  bone]
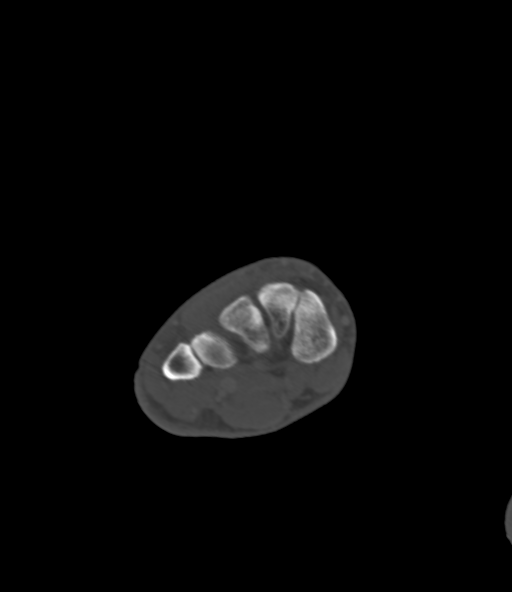
[im 73/135  bone]
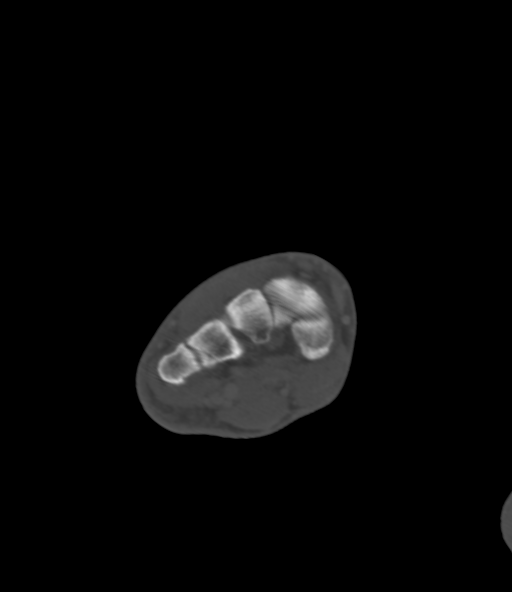

[Series 15: bone cor · oblique · 0.27mm/px · 3 of 74 slices shown, 4 images]
[im 1/74  soft-tissue]
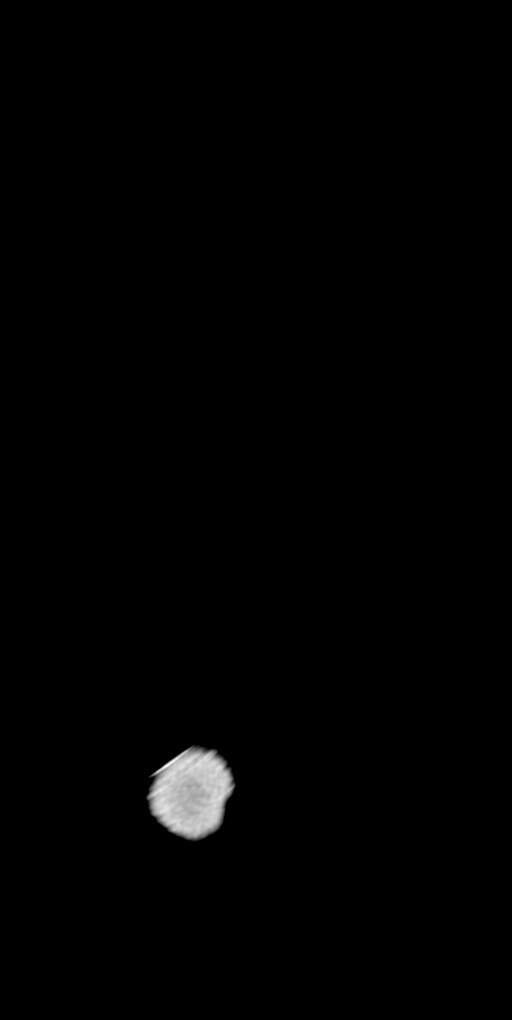
[im 1/74  bone]
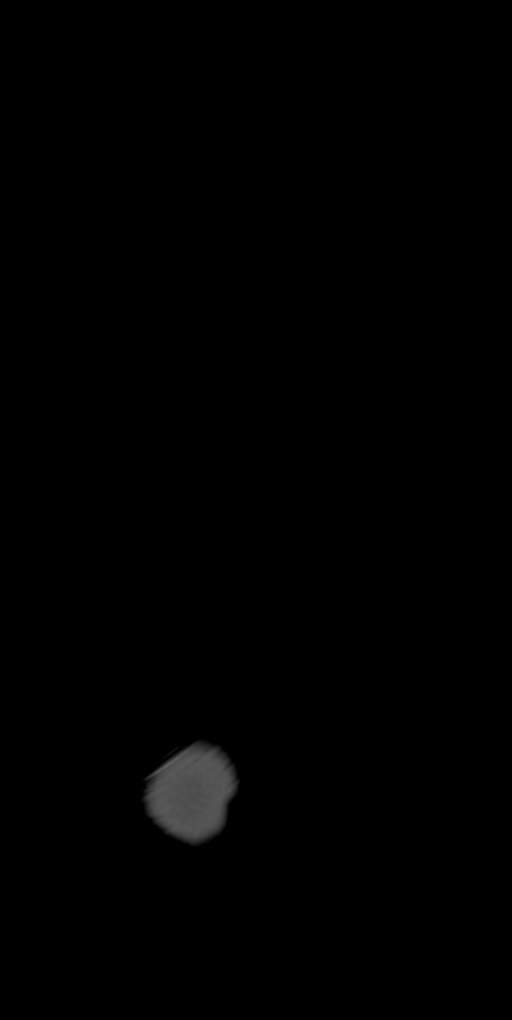
[im 37/74  bone]
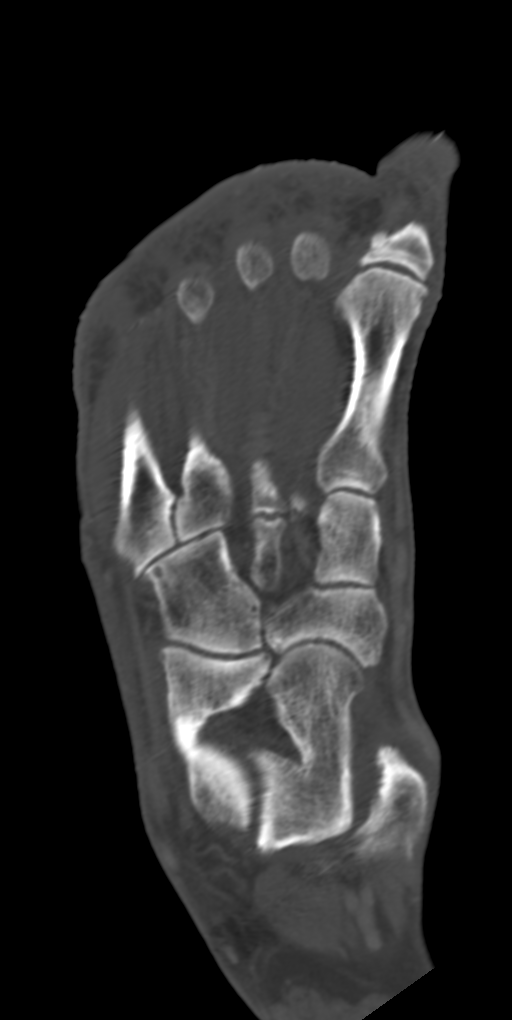
[im 74/74  bone]
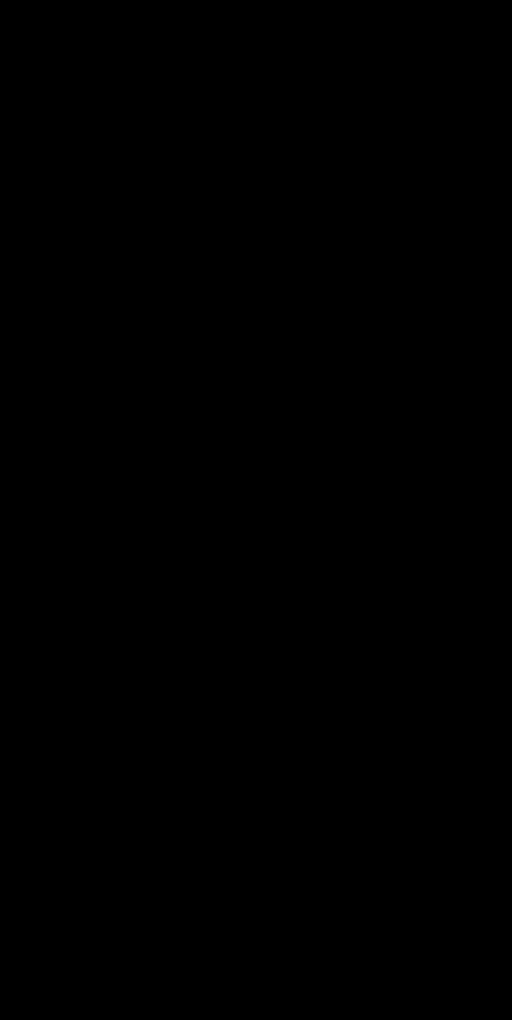

[6 of 20 positions shown; findings below may reference images not displayed]

FINDINGS: There is no acute fracture or dislocation. There is no lytic or
sclerotic osseous lesion. There is no periosteal reaction or bone
destruction.

There is soft tissue edema along the dorsal aspect and plantar
aspect of the forefoot. There is a complex fluid collection
measuring 7.6 x 18 x 30 mm with peripheral enhancement in the
webspace between the third and fourth toes extending into the
plantar aspect and into the third phalanx laterally with surrounding
edema most concerning for an abscess.

There is no other fluid collection or hematoma. The visualized
muscles are normal. The visualized flexor, extensor and peroneal
tendons are grossly intact.
IMPRESSION: 1. Cellulitis of the right forefoot with a 7.6 x 18 x 30 mm abscess
in the webspace between the third and fourth toes extending into the
plantar aspect and into the third phalanx laterally.

## 2016-05-28 ENCOUNTER — Encounter: Payer: Self-pay | Admitting: Family Medicine

## 2016-05-28 ENCOUNTER — Ambulatory Visit (INDEPENDENT_AMBULATORY_CARE_PROVIDER_SITE_OTHER): Payer: BLUE CROSS/BLUE SHIELD | Admitting: Family Medicine

## 2016-05-28 DIAGNOSIS — H02846 Edema of left eye, unspecified eyelid: Secondary | ICD-10-CM

## 2016-05-28 MED ORDER — DOXYCYCLINE HYCLATE 100 MG PO TABS: 100.0000 mg | ORAL_TABLET | Freq: Two times a day (BID) | ORAL | 0 refills | Status: AC

## 2016-05-28 MED ORDER — AZITHROMYCIN 1 % OP SOLN: 1.0000 [drp] | Freq: Two times a day (BID) | OPHTHALMIC | 0 refills | Status: AC

## 2016-05-28 NOTE — Progress Notes (Signed)
Name: Tanner [name]   MRN: [id number]    DOB: April 21, 1951   Date:05/28/2016       Progress Note  Subjective  Chief Complaint  Chief Complaint  Patient presents with  . Eye Problem    since yesterdal     Eye Pain   The left eye is affected. This is a new problem. The current episode started yesterday. The problem occurs constantly. There is no known exposure to pink eye. Associated symptoms include an eye discharge, eye redness and itching. Pertinent negatives include no fever. 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     Past Medical History:  Diagnosis Date  . Medical history non-contributory     Past Surgical History:  Procedure Laterality Date  . INCISION AND DRAINAGE OF WOUND Left 2006   left thigh  . IRRIGATION AND DEBRIDEMENT FOOT Right 02/24/2015   Procedure: IRRIGATION AND DEBRIDEMENT FOOT;  Surgeon: Tanner [name], DPM;  Location: [facility] ORS;  Service: Podiatry;  Laterality: Right;    No family history on file.  Social History   Social History  . Marital status: Married    Spouse name: N/A  . Number of children: N/A  . Years of education: N/A   Occupational History  . Not on file.   Social History Main Topics  . Smoking status: Never Smoker  . Smokeless tobacco: Never Used  . Alcohol use 0.0 oz/week     Comment: occassional  . Drug use: No  . Sexual activity: Not on file   Other Topics Concern  . Not on file   Social History Narrative  . No narrative on file    No current outpatient prescriptions on file.  No Known Allergies   Review of Systems  Constitutional: Negative for fever.  Eyes: Positive for pain, discharge, redness and itching.    Objective  Vitals:   05/28/16 1121  BP: 128/72  Pulse: 89  Resp: 16  Temp: 98.1 F (36.7 C)  TempSrc: Oral  SpO2: 97%  Weight: 183 lb 3.2 oz (83.1 kg)  Height: 5\' 3"  (1.6 m)    Physical Exam  Constitutional: He is oriented to person, place, and time and well-developed,  well-nourished, and in no distress.  HENT:  Head: Normocephalic and atraumatic.  Nose: Right sinus exhibits no maxillary sinus tenderness. Left sinus exhibits no maxillary sinus tenderness.  Mouth/Throat: No posterior oropharyngeal erythema.  Eyes: Right eye exhibits no chemosis, no discharge and no hordeolum. No foreign body present in the right eye. Left eye exhibits chemosis and discharge. Left eye exhibits no hordeolum. Foreign body present in the left eye. Left conjunctiva is not injected. Left conjunctiva has no hemorrhage. Left eye exhibits normal extraocular motion. Left pupil is round and reactive.  Left lower eyelid is mildly tender to palpation, swollen over the nasal aspect, no frank discharge visible, normal EOM,   Neck: Neck supple.  Neurological: He is alert and oriented to person, place, and time.  Psychiatric: Mood, memory, affect and judgment normal.  Nursing note and vitals reviewed.      Assessment & Plan  1. Swelling of left eyelid Likely blepharitis, started on azithromycin ophthalmic, if symptoms are unresponsive, may start taking doxycycline. Patient verbalized agreement - azithromycin (AZASITE) 1 % ophthalmic solution; Place 1 drop into the left eye 2 (two) times daily. Start with 1 drop in left eye bid x 2 days, then 1 drop every day for 5 days  Dispense: 0.5 mL; Refill: 0 - doxycycline (VIBRA-TABS)  100 MG tablet; Take 1 tablet (100 mg total) by mouth 2 (two) times daily.  Dispense: 14 tablet; Refill: 0   Syed Asad A. Faylene KurtzShah [facility] 05/28/2016 11:55 AM

## 2017-01-17 ENCOUNTER — Ambulatory Visit (INDEPENDENT_AMBULATORY_CARE_PROVIDER_SITE_OTHER): Payer: BLUE CROSS/BLUE SHIELD

## 2017-01-17 DIAGNOSIS — Z23 Encounter for immunization: Secondary | ICD-10-CM

## 2017-07-21 ENCOUNTER — Ambulatory Visit (INDEPENDENT_AMBULATORY_CARE_PROVIDER_SITE_OTHER): Payer: BLUE CROSS/BLUE SHIELD | Admitting: Family Medicine

## 2017-07-21 ENCOUNTER — Encounter: Payer: Self-pay | Admitting: Family Medicine

## 2017-07-21 VITALS — BP 130/70 | HR 100 | Temp 98.0°F | Resp 18 | Ht 63.0 in | Wt 179.4 lb

## 2017-07-21 DIAGNOSIS — J069 Acute upper respiratory infection, unspecified: Secondary | ICD-10-CM

## 2017-07-21 MED ORDER — GUAIFENESIN ER 600 MG PO TB12: 600.0000 mg | ORAL_TABLET | Freq: Two times a day (BID) | ORAL | 0 refills | Status: DC | PRN

## 2017-07-21 MED ORDER — MAGIC MOUTHWASH W/LIDOCAINE: 5.0000 mL | Freq: Three times a day (TID) | ORAL | 0 refills | Status: DC | PRN

## 2017-07-21 MED ORDER — FLUTICASONE PROPIONATE 50 MCG/ACT NA SUSP: 2.0000 | Freq: Every day | NASAL | 0 refills | Status: DC

## 2017-07-21 MED ORDER — BENZONATATE 100 MG PO CAPS: 100.0000 mg | ORAL_CAPSULE | Freq: Three times a day (TID) | ORAL | 0 refills | Status: DC | PRN

## 2017-07-21 NOTE — Patient Instructions (Signed)
Cool Mist Vaporizer A cool mist vaporizer is a device that releases a cool mist into the air. If you have a cough or a cold, using a vaporizer may help relieve your symptoms. The mist adds moisture to the air, which may help thin your mucus and make it less sticky. When your mucus is thin and less sticky, it easier for you to breathe and to cough up secretions. Do not use a vaporizer if you are allergic to mold. Follow these instructions at home:  Follow the instructions that come with the vaporizer.  Do not use anything other than distilled water in the vaporizer.  Do not run the vaporizer all of the time. Doing that can cause mold or bacteria to grow in the vaporizer.  Clean the vaporizer after each time that you use it.  Clean and dry the vaporizer well before storing it.  Stop using the vaporizer if your breathing symptoms get worse. This information is not intended to replace advice given to you by your health care provider. Make sure you discuss any questions you have with your health care provider. Document Released: 01/11/2004 Document Revised: 11/03/2015 Document Reviewed: 07/15/2015 Elsevier Interactive Patient Education  2018 Elsevier Inc[employer]   Upper Respiratory Infection, Adult Most upper respiratory infections (URIs) are caused by a virus. A URI affects the nose, throat, and upper air passages. The most common type of URI is often called "the common cold." Follow these instructions at home:  Take medicines only as told by your doctor.  Gargle warm saltwater or take cough drops to comfort your throat as told by your doctor.  Use a warm mist humidifier or inhale steam from a shower to increase air moisture. This may make it easier to breathe.  Drink enough fluid to keep your pee (urine) clear or pale yellow.  Eat soups and other clear broths.  Have a healthy diet.  Rest as needed.  Go back to work when your fever is gone or your doctor says it is okay. ? You may need  to stay home longer to avoid giving your URI to others. ? You can also wear a face mask and wash your hands often to prevent spread of the virus.  Use your inhaler more if you have asthma.  Do not use any tobacco products, including cigarettes, chewing tobacco, or electronic cigarettes. If you need help quitting, ask your doctor. Contact a doctor if:  You are getting worse, not better.  Your symptoms are not helped by medicine.  You have chills.  You are getting more short of breath.  You have brown or red mucus.  You have yellow or brown discharge from your nose.  You have pain in your face, especially when you bend forward.  You have a fever.  You have puffy (swollen) neck glands.  You have pain while swallowing.  You have white areas in the back of your throat. Get help right away if:  You have very bad or constant: ? Headache. ? Ear pain. ? Pain in your forehead, behind your eyes, and over your cheekbones (sinus pain). ? Chest pain.  You have long-lasting (chronic) lung disease and any of the following: ? Wheezing. ? Long-lasting cough. ? Coughing up blood. ? A change in your usual mucus.  You have a stiff neck.  You have changes in your: ? Vision. ? Hearing. ? Thinking. ? Mood. This information is not intended to replace advice given to you by your health care provider. Make   sure you discuss any questions you have with your health care provider. Document Released: 10/02/2007 Document Revised: 12/17/2015 Document Reviewed: 07/21/2013 Elsevier Interactive Patient Education  2018 Elsevier Inc.   

## 2017-07-21 NOTE — Progress Notes (Signed)
Name: Tanner [name]   MRN: [id number]    DOB: 04/30/1950   Date:07/21/2017       Progress Note  Subjective  Chief Complaint  Chief Complaint  Patient presents with  . URI    cough, sore throat, headache, bodyache    HPI  Pt presents with 3-5 days of URI symptoms - nasal congestion, post-nasal drainage, cough (dry) with throat pain, frontal headache, subjective fevers/chills, some body aches, ear pressure.  Denies chest pain, shortness of breath, NVD, vision changes.  Has been taking Claritin which provides some relief.  Patient Active Problem List   Diagnosis Date Noted  . Swelling of left eyelid 05/28/2016  . Acute non-recurrent maxillary sinusitis 08/17/2015    Social History   Tobacco Use  . Smoking status: Never Smoker  . Smokeless tobacco: Never Used  Substance Use Topics  . Alcohol use: Yes    Alcohol/week: 0.0 oz    Comment: occassional    No current outpatient medications on file.  No Known Allergies  ROS  Ten systems reviewed and is negative except as mentioned in HPI.  Objective  Vitals:   07/21/17 1008  BP: 130/70  Pulse: 100  Resp: 18  Temp: 98 F (36.7 C)  TempSrc: Oral  SpO2: 99%  Weight: 179 lb 6.4 oz (81.4 kg)  Height: 5\' 3"  (1.6 m)   Body mass index is 31.78 kg/m.  Nursing Note and Vital Signs reviewed.  Physical Exam Constitutional: Patient appears well-developed and well-nourished.  No distress.  HEENT: head atraumatic, normocephalic, pupils equal and reactive to light, EOM's intact, TM's without erythema or bulging, mild maxillary and frontal sinus tenderness, neck supple without lymphadenopathy, oropharynx pink and moist without exudate Cardiovascular: Normal rate, regular rhythm, S1/S2 present.  No murmur or rub heard. No BLE edema. Pulmonary/Chest: Effort normal and breath sounds clear. No respiratory distress or retractions. Psychiatric: Patient has a normal mood and affect. behavior is normal. Judgment and thought content  normal.  No results found for this or any previous visit (from the past 72 hour(s)).  Assessment & Plan  1. Upper respiratory tract infection, unspecified type - fluticasone (FLONASE) 50 MCG/ACT nasal spray; Place 2 sprays into both nostrils daily.  Dispense: 16 g; Refill: 0 - benzonatate (TESSALON) 100 MG capsule; Take 1-2 capsules (100-200 mg total) by mouth 3 (three) times daily as needed for cough.  Dispense: 20 capsule; Refill: 0 - guaiFENesin (MUCINEX) 600 MG 12 hr tablet; Take 1 tablet (600 mg total) by mouth 2 (two) times daily as needed for cough or to loosen phlegm.  Dispense: 20 tablet; Refill: 0 - magic mouthwash w/lidocaine SOLN; Take 5 mLs by mouth 3 (three) times daily as needed for mouth pain.  Dispense: 100 mL; Refill: 0  -Red flags and when to present for emergency care or RTC including fever >101.80F, chest pain, shortness of breath, new/worsening/un-resolving symptoms, reviewed with patient at time of visit. Follow up and care instructions discussed and provided in AVS.

## 2017-08-17 ENCOUNTER — Other Ambulatory Visit: Payer: Self-pay | Admitting: Family Medicine

## 2017-08-17 DIAGNOSIS — J069 Acute upper respiratory infection, unspecified: Secondary | ICD-10-CM

## 2018-01-12 ENCOUNTER — Ambulatory Visit (INDEPENDENT_AMBULATORY_CARE_PROVIDER_SITE_OTHER): Payer: BLUE CROSS/BLUE SHIELD

## 2018-01-12 DIAGNOSIS — Z23 Encounter for immunization: Secondary | ICD-10-CM | POA: Diagnosis not present

## 2018-04-20 ENCOUNTER — Ambulatory Visit: Payer: Self-pay

## 2018-04-20 NOTE — Telephone Encounter (Signed)
Call placed to patient. Unable to leave VM Call cut off.

## 2018-04-20 NOTE — Telephone Encounter (Signed)
Left VM to call office.

## 2018-04-21 ENCOUNTER — Ambulatory Visit: Payer: Self-pay

## 2018-04-21 ENCOUNTER — Ambulatory Visit (INDEPENDENT_AMBULATORY_CARE_PROVIDER_SITE_OTHER): Payer: BLUE CROSS/BLUE SHIELD | Admitting: Family Medicine

## 2018-04-21 ENCOUNTER — Encounter: Payer: Self-pay | Admitting: Family Medicine

## 2018-04-21 VITALS — BP 130/88 | HR 95 | Temp 98.0°F | Resp 16 | Ht 63.0 in | Wt 179.2 lb

## 2018-04-21 DIAGNOSIS — Z20818 Contact with and (suspected) exposure to other bacterial communicable diseases: Secondary | ICD-10-CM

## 2018-04-21 DIAGNOSIS — Z1211 Encounter for screening for malignant neoplasm of colon: Secondary | ICD-10-CM

## 2018-04-21 DIAGNOSIS — Z23 Encounter for immunization: Secondary | ICD-10-CM | POA: Diagnosis not present

## 2018-04-21 DIAGNOSIS — M1732 Unilateral post-traumatic osteoarthritis, left knee: Secondary | ICD-10-CM | POA: Insufficient documentation

## 2018-04-21 NOTE — Progress Notes (Signed)
Name: Tanner [name]   MRN: [id number]    DOB: 09/25/1950   Date:04/21/2018       Progress Note  Subjective  Chief Complaint  Chief Complaint  Patient presents with  . whooping cough    He and his wife were exposed to whooping cough by granddaughter.     HPI  Exposure to whooping cough: grandd-daughter went to her Pediatrician yesterday and has a high suspicion of whooping cough, [facility] and since he is the grandfather and sees them every day he would like to start on Azithromycin for prophylaxis. He is healthy otherwise.   OA :he has a long history of knee pain, had surgery on right side, left side still hurts but not taking medication. 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    Patient Active Problem List   Diagnosis Date Noted  . Post-traumatic osteoarthritis of left knee 04/21/2018    Past Surgical History:  Procedure Laterality Date  . INCISION AND DRAINAGE OF WOUND Left 2006   left thigh  . IRRIGATION AND DEBRIDEMENT FOOT Right 02/24/2015   Procedure: IRRIGATION AND DEBRIDEMENT FOOT;  Surgeon: Gwyneth RevelsJustin Fowler, DPM;  Location: [facility] ORS;  Service: Podiatry;  Laterality: Right;    Family History  Problem Relation Age of Onset  . Diabetes Father     Social History   Socioeconomic History  . Marital status: Married    Spouse name: Not on file  . Number of children: 2  . Years of education: Not on file  . Highest education level: Bachelor's degree (e.g., BA, AB, BS)  Occupational History  . Occupation: Surveyor, mineralscomputer engineer   Social Needs  . Financial resource strain: Not hard at all  . Food insecurity:    Worry: Never true    Inability: Never true  . Transportation needs:    Medical: No    Non-medical: No  Tobacco Use  . Smoking status: Never Smoker  . Smokeless tobacco: Never Used  Substance and Sexual Activity  . Alcohol use: Yes    Alcohol/week: 0.0 standard drinks    Comment:  occassional  . Drug use: No  . Sexual activity: Yes    Partners: Female    Birth control/protection: Post-menopausal  Lifestyle  . Physical activity:    Days per week: 4 days    Minutes per session: 10 min  . Stress: Not at all  Relationships  . Social connections:    Talks on phone: Not on file    Gets together: Not on file    Attends religious service: Not on file    Active member of club or organization: Not on file    Attends meetings of clubs or organizations: Not on file    Relationship status: Not on file  . Intimate partner violence:    Fear of current or ex partner: Not on file    Emotionally abused: Not on file    Physically abused: Not on file    Forced sexual activity: Not on file  Other Topics Concern  . Not on file  Social History Narrative  . Not on file    No current outpatient medications on file.  No Known Allergies  I personally reviewed active problem list, medication list, allergies, family history, social history with the patient/caregiver today.   ROS  Constitutional: Negative for fever or weight change.  Respiratory: Negative for cough and shortness of breath.  Cardiovascular: Negative for chest pain or palpitations.  Gastrointestinal: Negative for abdominal pain, no bowel changes.  Musculoskeletal: Negative for gait problem or joint swelling.  Skin: Negative for rash.  Neurological: Negative for dizziness or headache.  No other specific complaints in a complete review of systems (except as listed in HPI above).   Objective  Vitals:   04/21/18 1129  BP: 130/88  Pulse: 95  Resp: 16  Temp: 98 F (36.7 C)  TempSrc: Oral  SpO2: 98%  Weight: 179 lb 3.2 oz (81.3 kg)  Height: 5\' 3"  (1.6 m)    Body mass index is 31.74 kg/m.  Physical Exam  Constitutional: Patient appears well-developed and well-nourished. No distress.  HEENT: head atraumatic, normocephalic, pupils equal and reactive to light,  neck supple, throat within normal  limits Cardiovascular: Normal rate, regular rhythm and normal heart sounds.  No murmur heard. No BLE edema. Pulmonary/Chest: Effort normal and breath sounds normal. No respiratory distress. Abdominal: Soft.  There is no tenderness. Muscular skeletal: signs of OA on both hands and crepitus with extension of left knee more than right Psychiatric: Patient has a normal mood and affect. behavior is normal. Judgment and thought content normal.  PHQ2/9: Depression screen [facility] 2/9 04/21/2018 07/21/2017  Decreased Interest 0 0  Down, Depressed, Hopeless 0 0  PHQ - 2 Score 0 0     Fall Risk: Fall Risk  04/21/2018 07/21/2017  Falls in the past year? 0 No  Number falls in past yr: 0 -  Injury with Fall? 0 -      Assessment & Plan  1. Exposure to pertussis  Read recommendations from the [facility] website, we will wait for results from his grand-daughter and if positive for pertussis I will send azithromycin, explained we have 21 days to start medication   2. Need for vaccination for pneumococcus  - Pneumococcal conjugate vaccine 13-valent IM  3. Need for Tdap vaccination  He will check coverage with insurance, he thinks they just pay if he has a cut   4. Post-traumatic osteoarthritis of left knee  From wrestling injury, he does not take medication, discussed resuming gazelle machine  5. Colon cancer screening  - Cologuard

## 2018-04-21 NOTE — Telephone Encounter (Signed)
  Pt called stating that he was exposed to confirmed Whooping cough and would it was suggested that he be given a prophylactic antibiotic.  When an appointment was offered pt call dropped.  Call placed to patient. Pt states he would rather request that an RX be sent instead of appointment. [facility]. Per Tad Mooreasandra, Dr Carlynn PurlSowles requires an office appointment before prescribing. Appointment scheduled per protocol. No Care advice required.  Reason for Disposition . Caller has medication question only, adult not sick, and triager answers question  Answer Assessment - Initial Assessment Questions 1. SYMPTOMS: "Do you have any symptoms?"     no 2. SEVERITY: If symptoms are present, ask "Are they mild, moderate or severe?"     None just need prophlatic for exposure to Whooping cough  Protocols used: MEDICATION QUESTION CALL-A-AH

## 2018-06-04 ENCOUNTER — Ambulatory Visit
Admission: RE | Admit: 2018-06-04 | Discharge: 2018-06-04 | Disposition: A | Discharge status: Home / Self Care | Payer: BLUE CROSS/BLUE SHIELD | Source: Ambulatory Visit | Attending: Nurse Practitioner | Admitting: Nurse Practitioner

## 2018-06-04 ENCOUNTER — Encounter: Payer: Self-pay | Admitting: Nurse Practitioner

## 2018-06-04 ENCOUNTER — Ambulatory Visit (INDEPENDENT_AMBULATORY_CARE_PROVIDER_SITE_OTHER): Payer: BLUE CROSS/BLUE SHIELD | Admitting: Nurse Practitioner

## 2018-06-04 ENCOUNTER — Ambulatory Visit
Admission: RE | Admit: 2018-06-04 | Discharge: 2018-06-04 | Disposition: A | Discharge status: Home / Self Care | Payer: BLUE CROSS/BLUE SHIELD | Attending: Nurse Practitioner | Admitting: Nurse Practitioner

## 2018-06-04 VITALS — BP 124/82 | HR 97 | Temp 98.5°F | Resp 16 | Ht 63.0 in | Wt 180.8 lb

## 2018-06-04 DIAGNOSIS — M79675 Pain in left toe(s): Secondary | ICD-10-CM | POA: Diagnosis present

## 2018-06-04 DIAGNOSIS — Z5181 Encounter for therapeutic drug level monitoring: Secondary | ICD-10-CM

## 2018-06-04 NOTE — Patient Instructions (Addendum)
- After you complete labs please go across the street to get imaging of your tow to ensure there is not a fracture.  - Please, rest, elevate and use ice on the affected area for about 20 minutes 4-5 times a day.  Gout  Gout is a condition that causes painful swelling of the joints. Gout is a type of inflammation of the joints (arthritis). This condition is caused by having too much uric acid in the body. Uric acid is a chemical that forms when the body breaks down substances called purines. Purines are important for building body proteins. When the body has too much uric acid, sharp crystals can form and build up inside the joints. This causes pain and swelling. Gout attacks can happen quickly and may be very painful (acute gout). Over time, the attacks can affect more joints and become more frequent (chronic gout). Gout can also cause uric acid to build up under the skin and inside the kidneys. What are the causes? This condition is caused by too much uric acid in your blood. This can happen because:  Your kidneys do not remove enough uric acid from your blood. This is the most common cause.  Your body makes too much uric acid. This can happen with some cancers and cancer treatments. It can also occur if your body is breaking down too many red blood cells (hemolytic anemia).  You eat too many foods that are high in purines. These foods include organ meats and some seafood. Alcohol, especially beer, is also high in purines. A gout attack may be triggered by trauma or stress. What increases the risk? You are more likely to develop this condition if you:  Have a family history of gout.  Are male and middle-aged.  Are male and have gone through menopause.  Are obese.  Frequently drink alcohol, especially beer.  Are dehydrated.  Lose weight too quickly.  Have an organ transplant.  Have lead poisoning.  Take certain medicines, including aspirin, cyclosporine, diuretics, levodopa, and  niacin.  Have kidney disease.  Have a skin condition called psoriasis. What are the signs or symptoms? An attack of acute gout happens quickly. It usually occurs in just one joint. The most common place is the big toe. Attacks often start at night. Other joints that may be affected include joints of the feet, ankle, knee, fingers, wrist, or elbow. Symptoms of this condition may include:  Severe pain.  Warmth.  Swelling.  Stiffness.  Tenderness. The affected joint may be very painful to touch.  Shiny, red, or purple skin.  Chills and fever. Chronic gout may cause symptoms more frequently. More joints may be involved. You may also have white or yellow lumps (tophi) on your hands or feet or in other areas near your joints. How is this diagnosed? This condition is diagnosed based on your symptoms, medical history, and physical exam. You may have tests, such as:  Blood tests to measure uric acid levels.  Removal of joint fluid with a thin needle (aspiration) to look for uric acid crystals.  X-rays to look for joint damage. How is this treated? Treatment for this condition has two phases: treating an acute attack and preventing future attacks. Acute gout treatment may include medicines to reduce pain and swelling, including:  NSAIDs.  Steroids. These are strong anti-inflammatory medicines that can be taken by mouth (orally) or injected into a joint.  Colchicine. This medicine relieves pain and swelling when it is taken soon after an attack.  It can be given by mouth or through an IV. Preventive treatment may include:  Daily use of smaller doses of NSAIDs or colchicine.  Use of a medicine that reduces uric acid levels in your blood.  Changes to your diet. You may need to see a dietitian about what to eat and drink to prevent gout. Follow these instructions at home: During a gout attack   If directed, put ice on the affected area: ? Put ice in a plastic bag. ? Place a towel  between your skin and the bag. ? Leave the ice on for 20 minutes, 2-3 times a day.  Raise (elevate) the affected joint above the level of your heart as often as possible.  Rest the joint as much as possible. If the affected joint is in your leg, you may be given crutches to use.  Follow instructions from your health care provider about eating or drinking restrictions. Avoiding future gout attacks  Follow a low-purine diet as told by your dietitian or health care provider. Avoid foods and drinks that are high in purines, including liver, kidney, anchovies, asparagus, herring, mushrooms, mussels, and beer.  Maintain a healthy weight or lose weight if you are overweight. If you want to lose weight, talk with your health care provider. It is important that you do not lose weight too quickly.  Start or maintain an exercise program as told by your health care provider. Eating and drinking  Drink enough fluids to keep your urine pale yellow.  If you drink alcohol: ? Limit how much you use to:  0-1 drink a day for women.  0-2 drinks a day for men. ? Be aware of how much alcohol is in your drink. In the [place] one drink equals one 12 oz bottle of beer (355 mL) one 5 oz glass of wine (148 mL), or one 1 oz glass of hard liquor (44 mL). General instructions  Take over-the-counter and prescription medicines only as told by your health care provider.  Do not drive or use heavy machinery while taking prescription pain medicine.  Return to your normal activities as told by your health care provider. Ask your health care provider what activities are safe for you.  Keep all follow-up visits as told by your health care provider. This is important.  Low-Purine Eating Plan A low-purine eating plan involves making food choices to limit your intake of purine. Purine is a kind of uric acid. Too much uric acid in your blood can cause certain conditions, such as gout and kidney stones. Eating a low-purine  diet can help control these conditions. What are tips for following this plan? Reading food labels  Avoid foods with saturated or Trans fat.  Check the ingredient list of grains-based foods, such as bread and cereal, to make sure that they contain whole grains.  Check the ingredient list of sauces or soups to make sure they do not contain meat or fish.  When choosing soft drinks, check the ingredient list to make sure they do not contain high-fructose corn syrup. Shopping  Buy plenty of fresh fruits and vegetables.  Avoid buying canned or fresh fish.  Buy dairy products labeled as low-fat or nonfat.  Avoid buying premade or processed foods. These foods are often high in fat, salt (sodium), and added sugar. Cooking  Use olive oil instead of butter when cooking. Oils like olive oil, canola oil, and sunflower oil contain healthy fats. Meal planning  Learn which foods do or do not  affect you. If you find out that a food tends to cause your gout symptoms to flare up, avoid eating that food. You can enjoy foods that do not cause problems. If you have any questions about a food item, talk with your dietitian or health care provider.  Limit foods high in fat, especially saturated fat. Fat makes it harder for your body to get rid of uric acid.  Choose foods that are lower in fat and are lean sources of protein. General guidelines  Limit alcohol intake to no more than 1 drink a day for nonpregnant women and 2 drinks a day for men. One drink equals 12 oz of beer, 5 oz of wine, or 1 oz of hard liquor. Alcohol can affect the way your body gets rid of uric acid.  Drink plenty of water to keep your urine clear or pale yellow. Fluids can help remove uric acid from your body.  If directed by your health care provider, take a vitamin C supplement.  Work with your health care provider and dietitian to develop a plan to achieve or maintain a healthy weight. Losing weight can help reduce uric acid  in your blood. What foods are recommended? The items listed may not be a complete list. Talk with your dietitian about what dietary choices are best for you. Foods low in purines Foods low in purines do not need to be limited. These include:  All fruits.  All low-purine vegetables, pickles, and olives.  Breads, pasta, rice, cornbread, and popcorn. Cake and other baked goods.  All dairy foods.  Eggs, nuts, and nut butters.  Spices and condiments, such as salt, herbs, and vinegar.  Plant oils, butter, and margarine.  Water, sugar-free soft drinks, tea, coffee, and cocoa.  Vegetable-based soups, broths, sauces, and gravies. Foods moderate in purines Foods moderate in purines should be limited to the amounts listed.   cup of asparagus, cauliflower, spinach, mushrooms, or green peas, each day.  2/3 cup uncooked oatmeal, each day.   cup dry wheat bran or wheat germ, each day.  2-3 ounces of meat or poultry, each day.  4-6 ounces of shellfish, such as crab, lobster, oysters, or shrimp, each day.  1 cup cooked beans, peas, or lentils, each day.  Soup, broths, or bouillon made from meat or fish. Limit these foods as much as possible. What foods are not recommended? The items listed may not be a complete list. Talk with your dietitian about what dietary choices are best for you. Limit your intake of foods high in purines, including:  Beer and other alcohol.  Meat-based gravy or sauce.  Canned or fresh fish, such as: ? Anchovies, sardines, herring, and tuna. ? Mussels and scallops. ? Codfish, trout, and haddock.  Tanner [name].  Organ meats, such as: ? Liver or kidney. ? Tripe. ? Sweetbreads (thymus gland or pancreas).  Wild Education officer, environmental.  Yeast or yeast extract supplements.  Drinks sweetened with high-fructose corn syrup. Summary  Eating a low-purine diet can help control conditions caused by too much uric acid in the body, such as gout or kidney stones.  Choose  low-purine foods, limit alcohol, and limit foods high in fat.  You will learn over time which foods do or do not affect you. If you find out that a food tends to cause your gout symptoms to flare up, avoid eating that food. This information is not intended to replace advice given to you by your health care provider. Make sure you discuss  any questions you have with your health care provider. Document Released: 08/10/2010 Document Revised: 05/29/2016 Document Reviewed: 05/29/2016 Elsevier Interactive Patient Education  2019 [employer].

## 2018-06-04 NOTE — Progress Notes (Signed)
Name: Tanner [name]   MRN: [id number]    DOB: 10/17/1950   Date:06/04/2018       Progress Note  Subjective  Chief Complaint  Chief Complaint  Patient presents with  . Toe Injury    patient presents with left great toe pain. 1st notice last night. some redness.    HPI  Patient states injured toe- just banged it on door frame while working a few days ago.  He did not think anything of it but then last night woke up from sleep with left toe pain- very tender then. States right now pain is manageable, has not taken anything for pain.  Area is red.  Has never had gout before.  States had a beer and a little bit of seafood recently; eats a lot of bacon, gravy.  No fevers chills.   Patient Active Problem List   Diagnosis Date Noted  . Post-traumatic osteoarthritis of left knee 04/21/2018    Past Medical History:  Diagnosis Date  . Medical history non-contributory     Past Surgical History:  Procedure Laterality Date  . INCISION AND DRAINAGE OF WOUND Left 2006   left thigh  . IRRIGATION AND DEBRIDEMENT FOOT Right 02/24/2015   Procedure: IRRIGATION AND DEBRIDEMENT FOOT;  Surgeon: Gwyneth RevelsJustin Fowler, DPM;  Location: [facility];  Service: Podiatry;  Laterality: Right;    Social History   Tobacco Use  . Smoking status: Never Smoker  . Smokeless tobacco: Never Used  Substance Use Topics  . Alcohol use: Yes    Alcohol/week: 0.0 standard drinks    Comment: occassional     Current Outpatient Medications:  Marland Kitchen.  Multiple Vitamins-Minerals (MENS MULTIVITAMIN PO), Take by mouth., Disp: , Rfl:   No Known Allergies  ROS   No other specific complaints in a complete review of systems (except as listed in HPI above).  Objective  Vitals:   06/04/18 0956  BP: 124/82  Pulse: 97  Resp: 16  Temp: 98.5 F (36.9 C)  TempSrc: Oral  SpO2: 97%  Weight: 180 lb 12.8 oz (82 kg)  Height: 5\' 3"  (1.6 m)     Body mass index is 32.03 kg/m.  Nursing Note and Vital Signs reviewed.  Physical  Exam Constitutional:      Appearance: Normal appearance. He is well-developed.  HENT:     Head: Normocephalic and atraumatic.     Right Ear: Hearing normal.     Left Ear: Hearing normal.  Eyes:     Conjunctiva/sclera: Conjunctivae normal.  Cardiovascular:     Rate and Rhythm: Normal rate and regular rhythm.     Heart sounds: Normal heart sounds.  Pulmonary:     Effort: Pulmonary effort is normal.     Breath sounds: Normal breath sounds.  Musculoskeletal: Normal range of motion.       Feet:  Neurological:     Mental Status: He is alert and oriented to person, place, and time.  Psychiatric:        Speech: Speech normal.        Behavior: Behavior normal. Behavior is cooperative.        Thought Content: Thought content normal.        Judgment: Judgment normal.      No results found for this or any previous visit (from the past 48 hour(s)).  Assessment & Plan  1. Great toe pain, left Consider fracture versus gout.  Appears to be more gout related discussed diet, prevention, management.  Will send in appropriate  Rx after x-ray results return. - Uric acid - DG Toe Great Left; Future  2. Medication monitoring encounter Check kidney function prior to NSAIDs - BUN/Creatinine Ratio

## 2018-06-05 ENCOUNTER — Other Ambulatory Visit: Payer: Self-pay | Admitting: Nurse Practitioner

## 2018-06-05 ENCOUNTER — Telehealth: Payer: Self-pay

## 2018-06-05 LAB — BUN/CREATININE RATIO
BUN: 18 mg/dL (ref 7–25)
CREATININE: 0.83 mg/dL (ref 0.70–1.25)
GFR, EST AFRICAN AMERICAN: 106 mL/min/{1.73_m2} (ref >=60)
GFR, Est Non African American: 92 mL/min/{1.73_m2} (ref >=60)

## 2018-06-05 LAB — URIC ACID: Uric Acid, Serum: 7.1 mg/dL (ref 4.0–8.0)

## 2018-06-05 MED ORDER — INDOMETHACIN 25 MG PO CAPS: 25.0000 mg | ORAL_CAPSULE | Freq: Three times a day (TID) | ORAL | 0 refills | Status: DC

## 2018-06-05 NOTE — Telephone Encounter (Signed)
Copied from CRM [id number]. Topic: General - Other >> Jun 05, 2018  9:21 AM Herby Abraham C wrote: Reason for CRM: pt is calling in for him results.   CB: [phone]  I spoke with this patient this morning, but I will call him again.  I spoke with his wife and she said that we had already taken care of it.

## 2018-08-20 ENCOUNTER — Encounter: Payer: Self-pay | Admitting: Nurse Practitioner

## 2018-08-20 ENCOUNTER — Encounter: Payer: Self-pay | Admitting: Family Medicine

## 2018-09-22 ENCOUNTER — Telehealth: Payer: Self-pay | Admitting: Family Medicine

## 2018-09-22 ENCOUNTER — Encounter: Payer: Self-pay | Admitting: Family Medicine

## 2018-09-22 ENCOUNTER — Telehealth: Payer: Self-pay | Admitting: Hematology

## 2018-09-22 ENCOUNTER — Ambulatory Visit (INDEPENDENT_AMBULATORY_CARE_PROVIDER_SITE_OTHER): Payer: BLUE CROSS/BLUE SHIELD | Admitting: Family Medicine

## 2018-09-22 ENCOUNTER — Other Ambulatory Visit: Payer: BLUE CROSS/BLUE SHIELD

## 2018-09-22 VITALS — BP 115/65 | Temp 98.9°F

## 2018-09-22 DIAGNOSIS — R509 Fever, unspecified: Secondary | ICD-10-CM | POA: Diagnosis not present

## 2018-09-22 DIAGNOSIS — Z20822 Contact with and (suspected) exposure to covid-19: Secondary | ICD-10-CM

## 2018-09-22 DIAGNOSIS — J3489 Other specified disorders of nose and nasal sinuses: Secondary | ICD-10-CM

## 2018-09-22 MED ORDER — AZITHROMYCIN 500 MG PO TABS: ORAL_TABLET | ORAL | 0 refills | Status: DC

## 2018-09-22 NOTE — Telephone Encounter (Signed)
Patient had virtual appointment today and needs COVID-19 testing

## 2018-09-22 NOTE — Telephone Encounter (Signed)
Orders placed and patient is scheduled.

## 2018-09-22 NOTE — Progress Notes (Signed)
Name: Tanner [name]   MRN: [id number]    DOB: 02/17/1951   Date:5/26/202Lulu Riding0       Progress Note  Subjective  Chief Complaint  Chief Complaint  Patient presents with  . Sinusitis    headache, congestion, cough and low grade fever    I connected with  Claudio R Scrima  on 09/22/18 at 10:00 AM EDT by telephone, verified that I am speaking with the correct person using two identifiers.  I discussed the limitations of evaluation and management by telemedicine and the availability of in person appointments. The patient expressed understanding and agreed to proceed. Staff also discussed with the patient that there may be a patient responsible charge related to this service. Patient Location: at home [facility]   HPI  Sinus pressure: he states he developed chills, low grade fever and frontal headache going on for 4 days. No appetite No change in taste or sence of smell. He has a mild cough, that sometimes is productive no SOB. He lost 6 lbs in the past few days. He has nausea, but no vomiting or diarrhea. His wife had similar symptoms about 2 weeks ago. She was tested and was negative for COVID-19   He did have weight loss, lack of smell and taste back in march but that has resolved   Patient Active Problem List   Diagnosis Date Noted  . Post-traumatic osteoarthritis of left knee 04/21/2018    Past Surgical History:  Procedure Laterality Date  . INCISION AND DRAINAGE OF WOUND Left 2006   left thigh  . IRRIGATION AND DEBRIDEMENT FOOT Right 02/24/2015   Procedure: IRRIGATION AND DEBRIDEMENT FOOT;  Surgeon: Gwyneth RevelsJustin Fowler, DPM;  Location: [facility];  Service: Podiatry;  Laterality: Right;    Family History  Problem Relation Age of Onset  . Diabetes Father     Social History   Socioeconomic History  . Marital status: Married    Spouse name: Not on file  . Number of children: 2  . Years of education: Not on file  . Highest education level:  Bachelor's degree (e.g., BA, AB, BS)  Occupational History  . Occupation: Surveyor, mineralscomputer engineer   Social Needs  . Financial resource strain: Not hard at all  . Food insecurity:    Worry: Never true    Inability: Never true  . Transportation needs:    Medical: No    Non-medical: No  Tobacco Use  . Smoking status: Never Smoker  . Smokeless tobacco: Never Used  Substance and Sexual Activity  . Alcohol use: Yes    Alcohol/week: 0.0 standard drinks    Comment: occassional  . Drug use: No  . Sexual activity: Yes    Partners: Female    Birth control/protection: Post-menopausal  Lifestyle  . Physical activity:    Days per week: 4 days    Minutes per session: 10 min  . Stress: Not at all  Relationships  . Social connections:    Talks on phone: Not on file    Gets together: Not on file    Attends religious service: Not on file    Active member of club or organization: Not on file    Attends meetings of clubs or organizations: Not on file    Relationship status: Not on file  . Intimate partner violence:    Fear of current or ex partner: Not on file    Emotionally abused: Not on file    Physically abused: Not on file  Forced sexual activity: Not on file  Other Topics Concern  . Not on file  Social History Narrative  . Not on file     Current Outpatient Medications:  Marland Kitchen  Multiple Vitamins-Minerals (MENS MULTIVITAMIN PO), Take by mouth., Disp: , Rfl:  .  indomethacin (INDOCIN) 25 MG capsule, Take 1 capsule (25 mg total) by mouth 3 (three) times daily with meals. (Patient not taking: Reported on 09/22/2018), Disp: 30 capsule, Rfl: 0  No Known Allergies  I personally reviewed active problem list, medication list, allergies, family history, social history with the patient/caregiver today.   ROS  Ten systems reviewed and is negative except as mentioned in HPI   Objective  Virtual encounter, vitals not obtained.  There is no height or weight on file to calculate BMI.   Physical Exam  Awake, alert and oriented   PHQ2/9: Depression screen [facility] 2/9 09/22/2018 06/04/2018 04/21/2018 07/21/2017  Decreased Interest 0 0 0 0  Down, Depressed, Hopeless 0 0 0 0  PHQ - 2 Score 0 0 0 0  Altered sleeping 0 - - -  Tired, decreased energy 0 - - -  Change in appetite 0 - - -  Feeling bad or failure about yourself  0 - - -  Trouble concentrating 0 - - -  Moving slowly or fidgety/restless 0 - - -  Suicidal thoughts 0 - - -  PHQ-9 Score 0 - - -  Difficult doing work/chores Not difficult at all - - -   PHQ-2/9 Result is negative.    Fall Risk: Fall Risk  09/22/2018 06/04/2018 04/21/2018 07/21/2017  Falls in the past year? 0 0 0 No  Number falls in past yr: 0 0 0 -  Injury with Fall? 0 0 0 -    Assessment & Plan  1. Fever and chills  Sending for COVId-19 test  2. Sinus pressure  - azithromycin (ZITHROMAX) 500 MG tablet; Take it daily  Dispense: 3 tablet; Refill: 0  I discussed the assessment and treatment plan with the patient. The patient was provided an opportunity to ask questions and all were answered. The patient agreed with the plan and demonstrated an understanding of the instructions.  The patient was advised to call back or seek an in-person evaluation if the symptoms worsen or if the condition fails to improve as anticipated.  I provided 25 minutes of non-face-to-face time during this encounter.

## 2018-09-22 NOTE — Telephone Encounter (Signed)
Referred by Dr. Carlynn Purl for COVID testing. / Appointment scheduled and orders placed

## 2018-09-23 LAB — NOVEL CORONAVIRUS, NAA: SARS-CoV-2, NAA: NOT DETECTED

## 2018-12-25 ENCOUNTER — Encounter: Payer: BLUE CROSS/BLUE SHIELD | Admitting: Family Medicine

## 2019-01-26 ENCOUNTER — Other Ambulatory Visit: Payer: Self-pay

## 2019-01-26 ENCOUNTER — Ambulatory Visit (INDEPENDENT_AMBULATORY_CARE_PROVIDER_SITE_OTHER): Payer: Medicare Other

## 2019-01-26 DIAGNOSIS — Z23 Encounter for immunization: Secondary | ICD-10-CM | POA: Diagnosis not present

## 2019-03-24 ENCOUNTER — Encounter: Payer: BC Managed Care – PPO | Admitting: Family Medicine

## 2019-05-25 ENCOUNTER — Encounter: Payer: Self-pay | Admitting: Family Medicine

## 2019-06-24 ENCOUNTER — Ambulatory Visit: Payer: Self-pay | Admitting: *Deleted

## 2019-06-24 NOTE — Telephone Encounter (Signed)
Pt.notified

## 2019-06-24 NOTE — Telephone Encounter (Signed)
Pt called with concerns that he woke up with a rash on left shoulder and neck; he put Blue Emu Muscle Pain relief salve and a heating pad to the area; the area "is red, bumpy, and kind of a whelps"; the pt says that he did not read not to apply heating pad to area until after he had done it; recommendatons made per nurse triage protocol; he verbalized understanding; the pt sees Dr Carlynn Purl, Evalee Jefferson, but she has no availability within timeframe per guidelines; he prefers to be seen in office algorithm completed; pt offered and accepted appointment with Danelle Berry 06/25/19 at 1300; he verbalized understanding; will route to office for notification.  Reason for Disposition . [1] Looks infected (spreading redness, pus) AND [2] no fever  Answer Assessment - Initial Assessment Questions 1. APPEARANCE of RASH: "Describe the rash."      Red, bumpy, whelps, blistery 2. LOCATION: "Where is the rash located?"     Left shoulder and neck 3. NUMBER: "How many spots are there?"      Too many to count 4. SIZE: "How big are the spots?" (Inches, centimeters or compare to size of a coin)      "Smaller than a coin" 5. ONSET: "When did the rash start?"      Noticed at 0800 6. ITCHING: "Does the rash itch?" If so, ask: "How bad is the itch?"  (Scale 1-10; or mild, moderate, severe)     no 7. PAIN: "Does the rash hurt?" If so, ask: "How bad is the pain?"  (Scale 1-10; or mild, moderate, severe)     no 8. OTHER SYMPTOMS: "Do you have any other symptoms?" (e.g., fever)    no 9. PREGNANCY: "Is there any chance you are pregnant?" "When was your last menstrual period?"     n/a  Protocols used: RASH OR REDNESS - LOCALIZED-A-AH

## 2019-06-25 ENCOUNTER — Other Ambulatory Visit: Payer: Self-pay

## 2019-06-25 ENCOUNTER — Encounter: Payer: Self-pay | Admitting: Family Medicine

## 2019-06-25 ENCOUNTER — Ambulatory Visit (INDEPENDENT_AMBULATORY_CARE_PROVIDER_SITE_OTHER): Payer: Medicare Other | Admitting: Family Medicine

## 2019-06-25 VITALS — BP 140/84 | HR 76 | Temp 97.7°F | Resp 16 | Ht 63.0 in | Wt 179.7 lb

## 2019-06-25 DIAGNOSIS — M25512 Pain in left shoulder: Secondary | ICD-10-CM | POA: Diagnosis not present

## 2019-06-25 DIAGNOSIS — R21 Rash and other nonspecific skin eruption: Secondary | ICD-10-CM

## 2019-06-25 MED ORDER — MELOXICAM 15 MG PO TABS: 15.0000 mg | ORAL_TABLET | Freq: Every day | ORAL | 2 refills | Status: DC

## 2019-06-25 MED ORDER — PREDNISONE 10 MG (21) PO TBPK: ORAL_TABLET | ORAL | 0 refills | Status: DC

## 2019-06-25 NOTE — Progress Notes (Signed)
Patient ID: Armandina Gemma, male    DOB: 1950-11-18, 69 y.o.   MRN: [id number]  PCP: Steele Sizer, MD  No chief complaint on file.   Subjective:   Ridley BRANDN MCGATH is a 69 y.o. male, presents to clinic with CC of the following: Pt presents for Rash Recently called the nurse line: Pt called with concerns that he woke up with a rash on left shoulder and neck; he put Blue Emu Muscle Pain relief salve and a heating pad to the area; the area "is red, bumpy, and kind of a whelps"; the pt says that he did not read not to apply heating pad to area until after he had done it; recommendatons made per nurse triage protocol; he verbalized understanding; the pt sees Dr Ancil Boozer, Inda Castle, but she has no availability within timeframe per guidelines; he prefers to be seen in office algorithm completed; pt offered and accepted appointment with Delsa Grana 06/25/19 at 1300; he verbalized understanding; will route to office for notification. 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  This led to a rash across his back from his shoulder to the back of his neck that is red blistered and raised.  He had some mild associated burning with it, no itching, and no radiation of pain anywhere.  He states that he has never had chickenpox so he does not believe it could be shingles but there are some little blisters that have popped up.  The only pain he has right now is the same shoulder pain that he was initially trying to treat.  He began to have sharp anterior shoulder pain when doing striction using a hammer,  he reports a history of bone spurs in the past.  Currently it is very painful for him to externally rotate or lift his arm above the level of his shoulder.  He saw Ortho in the past but it was many years ago, he states they gave him some surgical options which they said could not prevent it from getting worse or needing surgeries in the future so he usually manages with creams or over-the-counter medication when it bothers him and it gets better.  He denies any weakness numbness or tingling in his left arm, denies any injury or strain in the past.  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   Patient Active Problem List   Diagnosis Date Noted  . Post-traumatic osteoarthritis of left knee 04/21/2018      Current Outpatient Medications:  .  azithromycin (ZITHROMAX) 500 MG tablet, Take it daily, Disp: 3 tablet, Rfl: 0 .  indomethacin (INDOCIN) 25 MG capsule, Take 1 capsule (25 mg total) by mouth 3 (three) times daily with meals. (Patient not taking: Reported on 09/22/2018), Disp: 30 capsule, Rfl: 0 .  Multiple Vitamins-Minerals (MENS MULTIVITAMIN PO), Take by mouth., Disp: , Rfl:    No Known Allergies   Family  History  Problem Relation Age of Onset  . Diabetes Father      Social History   Socioeconomic History  . Marital status: Married    Spouse name: Not on file  . Number of children: 2  . Years of education: Not on file  . Highest education level: Bachelor's degree (e.g., BA, AB, BS)  Occupational History  . Occupation: Surveyor, minerals   Tobacco Use  . Smoking status: Never Smoker  . Smokeless tobacco: Never Used  Substance and Sexual Activity  . Alcohol use: Yes    Alcohol/week: 0.0 standard drinks    Comment: occassional  . Drug use: No  . Sexual activity: Yes    Partners: Female    Birth control/protection: Post-menopausal  Other Topics Concern  . Not on  file  Social History Narrative  . Not on file   Social Determinants of Health   Financial Resource Strain:   . Difficulty of Paying Living Expenses: Not on file  Food Insecurity:   . Worried About Programme researcher, broadcasting/film/video in the Last Year: Not on file  . Ran Out of Food in the Last Year: Not on file  Transportation Needs:   . Lack of Transportation (Medical): Not on file  . Lack of Transportation (Non-Medical): Not on file  Physical Activity:   . Days of Exercise per Week: Not on file  . Minutes of Exercise per Session: Not on file  Stress:   . Feeling of Stress : Not on file  Social Connections:   . Frequency of Communication with Friends and Family: Not on file  . Frequency of Social Gatherings with Friends and Family: Not on file  . Attends Religious Services: Not on file  . Active Member of Clubs or Organizations: Not on file  . Attends Banker Meetings: Not on file  . Marital Status: Not on file  Intimate Partner Violence:   . Fear of Current or Ex-Partner: Not on file  . Emotionally Abused: Not on file  . Physically Abused: Not on file  . Sexually Abused: Not on file    Chart Review Today: I personally reviewed active problem list, medication list, allergies, family history, social history, health maintenance, notes from last encounter, lab results, imaging with the patient/caregiver today.   Review of Systems 10 Systems reviewed and are negative for acute change except as noted in the HPI.     Objective:   There were no vitals filed for this visit.  There is no height or weight on file to calculate BMI.  Physical Exam Vitals and nursing note reviewed.  Constitutional:      General: He is not in acute distress.    Appearance: Normal appearance. He is not ill-appearing, toxic-appearing or diaphoretic.  HENT:     Head: Atraumatic.  Neck:      Comments: Area of rash - confluent area of mild edema and erythema with scattered small  vessicles Musculoskeletal:     Left shoulder: Tenderness present. No bony tenderness or crepitus. Decreased range of motion. Normal strength. Normal pulse.     Cervical back: Full passive range of motion without pain, normal range of motion and neck supple. No rigidity. No pain with movement, spinous process tenderness or muscular tenderness. Normal range of motion.     Comments: Left shoulder held slightly asymmetrical compared to his right shoulder, he has tenderness to palpation to biceps groove and AC joint, has pain with external rotation and has positive Neer and Hawking  sign, range of motion limited with external rotation and flexion He has no tenderness to palpation to lateral glenohumeral fossa, negative empty can test and negative drop test Normal sensation to light touch in all nerve distributions of his left hand, normal capillary refill and good 2+ radial pulses  Skin:    General: Skin is warm.     Coloration: Skin is not jaundiced.     Findings: Erythema and rash present.  Neurological:     Mental Status: He is alert.  Psychiatric:        Mood and Affect: Mood normal.        Behavior: Behavior normal.            Assessment & Plan:      ICD-10-CM   1. Rash and nonspecific skin eruption  R21 predniSONE (STERAPRED UNI-PAK 21 TAB) 10 MG (21) TBPK tablet   likely a burn or contact dermatitis, advised to avoid any topical meds, allow to heal, expect will improve in 1-2 weeks  2. Left shoulder pain, unspecified chronicity  M25.512 meloxicam (MOBIC) 15 MG tablet    predniSONE (STERAPRED UNI-PAK 21 TAB) 10 MG (21) TBPK tablet    Ambulatory referral to Physical Therapy   hx of left shoulder pain intermittent, bone spurs, hurt after hammering, sharp pain, decreased ROM with flexion, abduction, external rotation impingement?     Plan for shoulder is to use Mobic, do steroid burst this would help with rash if it happens be contact dermatitis to help with shoulder if it happens to  be arthritic in nature or tendinitis, encouraged him to follow-up with physical therapy and let me know if he would like to see orthopedist again, today he is not very interested in a Ortho referral because his shoulder has done this several times he expects it will gradually get better again.    Danelle Berry, PA-C 06/25/19 1:04 PM

## 2019-06-25 NOTE — Patient Instructions (Signed)
Let us know if you need an ortho referral and we can put that in without requiring subsequent visits  If you require pain meds, you will need an appointment.   Shoulder Impingement Syndrome Rehab Ask your health care provider which exercises are safe for you. Do exercises exactly as told by your health care provider and adjust them as directed. It is normal to feel mild stretching, pulling, tightness, or discomfort as you do these exercises. Stop right away if you feel sudden pain or your pain gets worse. Do not begin these exercises until told by your health care provider. Stretching and range-of-motion exercise This exercise warms up your muscles and joints and improves the movement and flexibility of your shoulder. This exercise also helps to relieve pain and stiffness. Passive horizontal adduction In passive adduction, you use your other hand to move the injured arm toward your body. The injured arm does not move on its own. In this movement, your arm is moved across your body in the horizontal plane (horizontal adduction). 1. Sit or stand and pull your left / right elbow across your chest, toward your other shoulder. Stop when you feel a gentle stretch in the back of your shoulder and upper arm. ? Keep your arm at shoulder height. ? Keep your arm as close to your body as you comfortably can. 2. Hold for __________ seconds. 3. Slowly return to the starting position. Repeat __________ times. Complete this exercise __________ times a day. Strengthening exercises These exercises build strength and endurance in your shoulder. Endurance is the ability to use your muscles for a long time, even after they get tired. External rotation, isometric This is an exercise in which you press the back of your wrist against a door frame without moving your shoulder joint (isometric). 1. Stand or sit in a doorway, facing the door frame. 2. Bend your left / right elbow and place the back of your wrist against  the door frame. Only the back of your wrist should be touching the frame. Keep your upper arm at your side. 3. Gently press your wrist against the door frame, as if you are trying to push your arm away from your abdomen (external rotation). Press as hard as you are able without pain. ? Avoid shrugging your shoulder while you press your wrist against the door frame. Keep your shoulder blade tucked down toward the middle of your back. 4. Hold for __________ seconds. 5. Slowly release the tension, and relax your muscles completely before you repeat the exercise. Repeat __________ times. Complete this exercise __________ times a day. Internal rotation, isometric This is an exercise in which you press your palm against a door frame without moving your shoulder joint (isometric). 1. Stand or sit in a doorway, facing the door frame. 2. Bend your left / right elbow and place the palm of your hand against the door frame. Only your palm should be touching the frame. Keep your upper arm at your side. 3. Gently press your hand against the door frame, as if you are trying to push your arm toward your abdomen (internal rotation). Press as hard as you are able without pain. ? Avoid shrugging your shoulder while you press your hand against the door frame. Keep your shoulder blade tucked down toward the middle of your back. 4. Hold for __________ seconds. 5. Slowly release the tension, and relax your muscles completely before you repeat the exercise. Repeat __________ times. Complete this exercise __________ times a day. Scapular  protraction, supine  1. Lie on your back on a firm surface (supine position). Hold a __________ weight in your left / right hand. 2. Raise your left / right arm straight into the air so your hand is directly above your shoulder joint. 3. Push the weight into the air so your shoulder (scapula) lifts off the surface that you are lying on. The scapula will push up or forward (protraction).  Do not move your head, neck, or back. 4. Hold for __________ seconds. 5. Slowly return to the starting position. Let your muscles relax completely before you repeat this exercise. Repeat __________ times. Complete this exercise __________ times a day. Scapular retraction  1. Sit in a stable chair without armrests, or stand up. 2. Secure an exercise band to a stable object in front of you so the band is at shoulder height. 3. Hold one end of the exercise band in each hand. Your palms should face down. 4. Squeeze your shoulder blades together (retraction) and move your elbows slightly behind you. Do not shrug your shoulders upward while you do this. 5. Hold for __________ seconds. 6. Slowly return to the starting position. Repeat __________ times. Complete this exercise __________ times a day. Shoulder extension  1. Sit in a stable chair without armrests, or stand up. 2. Secure an exercise band to a stable object in front of you so the band is above shoulder height. 3. Hold one end of the exercise band in each hand. 4. Straighten your elbows and lift your hands up to shoulder height. 5. Squeeze your shoulder blades together and pull your hands down to the sides of your thighs (extension). Stop when your hands are straight down by your sides. Do not let your hands go behind your body. 6. Hold for __________ seconds. 7. Slowly return to the starting position. Repeat __________ times. Complete this exercise __________ times a day. This information is not intended to replace advice given to you by your health care provider. Make sure you discuss any questions you have with your health care provider. Document Revised: 08/07/2018 Document Reviewed: 05/11/2018 Elsevier Patient Education  2020 [employer]   Rash, Adult  A rash is a change in the color of your skin. A rash can also change the way your skin feels. There are many different conditions and factors that can cause a rash. Follow  these instructions at home: The goal of treatment is to stop the itching and keep the rash from spreading. Watch for any changes in your symptoms. Let your doctor know about them. Follow these instructions to help with your condition: Medicine Take or apply over-the-counter and prescription medicines only as told by your doctor. These may include medicines:  To treat red or swollen skin (corticosteroid creams).  To treat itching.  To treat an allergy (oral antihistamines).  To treat very bad symptoms (oral corticosteroids).  Skin care  Put cool cloths (compresses) on the affected areas.  Do not scratch or rub your skin.  Avoid covering the rash. Make sure that the rash is exposed to air as much as possible. Managing itching and discomfort  Avoid hot showers or baths. These can make itching worse. A cold shower may help.  Try taking a bath with: ? Epsom salts. You can get these at your local pharmacy or grocery store. Follow the instructions on the package. ? Baking soda. Pour a small amount into the bath as told by your doctor. ? Colloidal oatmeal. You can get this at  your local pharmacy or grocery store. Follow the instructions on the package.  Try putting baking soda paste onto your skin. Stir water into baking soda until it gets like a paste.  Try putting on a lotion that relieves itchiness (calamine lotion).  Keep cool and out of the sun. Sweating and being hot can make itching worse. General instructions   Rest as needed.  Drink enough fluid to keep your pee (urine) pale yellow.  Wear loose-fitting clothing.  Avoid scented soaps, detergents, and perfumes. Use gentle soaps, detergents, perfumes, and other cosmetic products.  Avoid anything that causes your rash. Keep a journal to help track what causes your rash. Write down: ? What you eat. ? What cosmetic products you use. ? What you drink. ? What you wear. This includes jewelry.  Keep all follow-up visits as  told by your doctor. This is important. Contact a doctor if:  You sweat at night.  You lose weight.  You pee (urinate) more than normal.  You pee less than normal, or you notice that your pee is a darker color than normal.  You feel weak.  You throw up (vomit).  Your skin or the whites of your eyes look yellow (jaundice).  Your skin: ? Tingles. ? Is numb.  Your rash: ? Does not go away after a few days. ? Gets worse.  You are: ? More thirsty than normal. ? More tired than normal.  You have: ? New symptoms. ? Pain in your belly (abdomen). ? A fever. ? Watery poop (diarrhea). Get help right away if:  You have a fever and your symptoms suddenly get worse.  You start to feel mixed up (confused).  You have a very bad headache or a stiff neck.  You have very bad joint pains or stiffness.  You have jerky movements that you cannot control (seizure).  Your rash covers all or most of your body. The rash may or may not be painful.  You have blisters that: ? Are on top of the rash. ? Grow larger. ? Grow together. ? Are painful. ? Are inside your nose or mouth.  You have a rash that: ? Looks like purple pinprick-sized spots all over your body. ? Has a "bull's eye" or looks like a target. ? Is red and painful, causes your skin to peel, and is not from being in the sun too long. Summary  A rash is a change in the color of your skin. A rash can also change the way your skin feels.  The goal of treatment is to stop the itching and keep the rash from spreading.  Take or apply over-the-counter and prescription medicines only as told by your doctor.  Contact a doctor if you have new symptoms or symptoms that get worse.  Keep all follow-up visits as told by your doctor. This is important. This information is not intended to replace advice given to you by your health care provider. Make sure you discuss any questions you have with your health care provider. Document  Revised: 08/07/2018 Document Reviewed: 11/17/2017 Elsevier Patient Education  2020 [employer].

## 2019-06-28 ENCOUNTER — Telehealth: Payer: Self-pay | Admitting: Family Medicine

## 2019-06-28 NOTE — Telephone Encounter (Signed)
Pt.notified
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
## 2019-06-30 ENCOUNTER — Other Ambulatory Visit: Payer: Self-pay

## 2019-06-30 ENCOUNTER — Encounter: Payer: Self-pay | Admitting: Internal Medicine

## 2019-06-30 ENCOUNTER — Ambulatory Visit (INDEPENDENT_AMBULATORY_CARE_PROVIDER_SITE_OTHER): Payer: Medicare Other | Admitting: Internal Medicine

## 2019-06-30 VITALS — BP 138/94 | HR 96 | Temp 97.3°F | Resp 16 | Ht 63.0 in | Wt 174.3 lb

## 2019-06-30 DIAGNOSIS — R030 Elevated blood-pressure reading, without diagnosis of hypertension: Secondary | ICD-10-CM | POA: Diagnosis not present

## 2019-06-30 DIAGNOSIS — L309 Dermatitis, unspecified: Secondary | ICD-10-CM

## 2019-06-30 DIAGNOSIS — T300 Burn of unspecified body region, unspecified degree: Secondary | ICD-10-CM | POA: Diagnosis not present

## 2019-06-30 MED ORDER — VALACYCLOVIR HCL 1 G PO TABS: 1000.0000 mg | ORAL_TABLET | Freq: Two times a day (BID) | ORAL | 0 refills | Status: DC
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
## 2019-06-30 NOTE — Patient Instructions (Addendum)
Burn Care, Adult A burn is an injury to the skin or the tissues under the skin. There are three types of burns:  First degree. These burns may cause the skin to be red and a bit swollen.  Second degree. These burns are very painful and cause the skin to be very red. The skin may also leak fluid, look shiny, and start to have blisters.  Third degree. These burns cause permanent damage. They turn the skin white or black and make it look charred, dry, and leathery. Taking care of your burn properly can help to prevent pain and infection. It can also help the burn to heal more quickly. How is this treated? Right after a burn:  Rinse or soak the burn under cool water. Do this for several minutes. Do not put ice on your burn. That can cause more damage.  Lightly cover the burn with a clean (sterile) cloth (dressing). Burn care  Raise (elevate) the injured area above the level of your heart while sitting or lying down.  Follow instructions from your doctor about: ? How to clean and take care of the burn. ? When to change and remove the cloth.  Check your burn every day for signs of infection. Check for: ? More redness, swelling, or pain. ? Warmth. ? Pus or a bad smell. Medicine   Take over-the-counter and prescription medicines only as told by your doctor.  If you were prescribed antibiotic medicine, take or apply it as told by your doctor. Do not stop using the antibiotic even if your condition improves. General instructions  To prevent infection: ? Do not put butter, oil, or other home treatments on the burn. ? Do not scratch or pick at the burn. ? Do not break any blisters. ? Do not peel skin.  Do not rub your burn, even when you are cleaning it.  Protect your burn from the sun. Contact a doctor if:  Your condition does not get better.  Your condition gets worse.  You have a fever.  Your burn looks different or starts to have black or red spots on it.  Your burn  feels warm to the touch.  Your pain is not controlled with medicine. Get help right away if:  You have redness, swelling, or pain at the site of the burn.  You have fluid, blood, or pus coming from your burn.  You have red streaks near the burn.  You have very bad pain. This information is not intended to replace advice given to you by your health care provider. Make sure you discuss any questions you have with your health care provider. Document Revised: 08/05/2018 Document Reviewed: 10/03/2015 Elsevier Patient Education  2020 [employer] Inc[employer]  

## 2019-07-13 ENCOUNTER — Ambulatory Visit (INDEPENDENT_AMBULATORY_CARE_PROVIDER_SITE_OTHER): Payer: Medicare Other | Admitting: Internal Medicine

## 2019-07-13 ENCOUNTER — Encounter: Payer: Self-pay | Admitting: Internal Medicine

## 2019-07-13 ENCOUNTER — Encounter: Payer: Medicare Other | Admitting: Family Medicine

## 2019-07-13 ENCOUNTER — Other Ambulatory Visit: Payer: Self-pay

## 2019-07-13 VITALS — BP 128/78 | HR 90 | Temp 97.3°F | Resp 16 | Ht 63.0 in | Wt 176.2 lb

## 2019-07-13 DIAGNOSIS — L309 Dermatitis, unspecified: Secondary | ICD-10-CM | POA: Diagnosis not present

## 2019-07-13 DIAGNOSIS — R030 Elevated blood-pressure reading, without diagnosis of hypertension: Secondary | ICD-10-CM | POA: Diagnosis not present

## 2019-07-13 NOTE — Progress Notes (Signed)
Patient ID: Lulu Riding, male    DOB: 09/29/1950, 69 y.o.   MRN: [id number]  PCP: Alba Cory, MD  Chief Complaint  Patient presents with  . Follow-up  . Burn    on neck  . Blisters    on neck    Subjective:   Bertil ELIHU MILSTEIN is a 69 y.o. male, presents to clinic with CC of the following:  Chief Complaint  Patient presents with  . Follow-up  . Burn    on neck  . Blisters    on neck    HPI: Patient is a 69 year old male who I last saw on 06/30/2019 for follow-up of a likely dermatitis/burn injury with the possibility of zoster also entertained.  His blood pressure was slightly elevated that visit as well, felt most likely due to his current symptoms. Prior notes reviewed.  He follows up today and notes that the rash has overall improved significantly.  He states after taking that Valtrex medicine, it seemed to help an improvement, and the pain is better. He noted concerns as some bumps have been popping up in the area of the rash, and also 1 in the left upper thigh region.  He has not been applying anything topically in the last couple days, with just using a bacitracin type or aloe product as was recommended before the last couple days. He noted a history of MRSA, and was concerned about that potential. He denies any fevers, feeling ill, and the bump areas have not opened and drained any purulent type of material.   Patient Active Problem List   Diagnosis Date Noted  . Post-traumatic osteoarthritis of left knee 04/21/2018      Current Outpatient Medications:  Marland Kitchen  Multiple Vitamins-Minerals (MENS MULTIVITAMIN PO), Take by mouth., Disp: , Rfl:  .  acetaminophen (TYLENOL) 500 MG tablet, Take 500 mg by mouth every 6 (six) hours as needed., Disp: , Rfl:  .  ibuprofen (ADVIL) 200 MG tablet, Take 200 mg by mouth every 6 (six) hours as needed., Disp: , Rfl:  .  meloxicam (MOBIC) 15 MG tablet, Take 1 tablet (15 mg total) by mouth daily. (Patient not taking: Reported on  06/30/2019), Disp: 30 tablet, Rfl: 2   No Known Allergies   Past Surgical History:  Procedure Laterality Date  . INCISION AND DRAINAGE OF WOUND Left 2006   left thigh  . IRRIGATION AND DEBRIDEMENT FOOT Right 02/24/2015   Procedure: IRRIGATION AND DEBRIDEMENT FOOT;  Surgeon: Gwyneth Revels, DPM;  Location: [facility] ORS;  Service: Podiatry;  Laterality: Right;     Family History  Problem Relation Age of Onset  . Diabetes Father      Social History   Tobacco Use  . Smoking status: Never Smoker  . Smokeless tobacco: Never Used  Substance Use Topics  . Alcohol use: Yes    Alcohol/week: 0.0 standard drinks    Comment: occassional    With staff assistance, above reviewed with the patient today.  ROS: As per HPI, otherwise no specific complaints on a limited and focused system review   No results found for this or any previous visit (from the past 72 hour(s)).   PHQ2/9: Depression screen [facility] 2/9 07/13/2019 06/30/2019 06/25/2019 09/22/2018 06/04/2018  Decreased Interest 0 0 0 0 0  Down, Depressed, Hopeless 0 0 0 0 0  PHQ - 2 Score 0 0 0 0 0  Altered sleeping 0 2 0 0 -  Tired, decreased energy 0 1 0 0 -  Change in appetite 0 0 0 0 -  Feeling bad or failure about yourself  0 0 0 0 -  Trouble concentrating 0 0 0 0 -  Moving slowly or fidgety/restless 0 0 0 0 -  Suicidal thoughts 0 0 0 0 -  PHQ-9 Score 0 3 0 0 -  Difficult doing work/chores Not difficult at all Not difficult at all Not difficult at all Not difficult at all -   PHQ-2/9 Result is neg  Fall Risk: Fall Risk  07/13/2019 06/30/2019 06/25/2019 09/22/2018 06/04/2018  Falls in the past year? 0 0 0 0 0  Number falls in past yr: 0 0 0 0 0  Injury with Fall? 0 0 0 0 0      Objective:   Vitals:   07/13/19 1051  BP: 128/78  Pulse: 90  Resp: 16  Temp: (!) 97.3 F (36.3 C)  TempSrc: Temporal  SpO2: 96%  Weight: 176 lb 3.2 oz (79.9 kg)  Height: 5\' 3"  (1.6 m)    Body mass index is 31.21 kg/m.  Physical Exam   NAD,  masked, pleasant HEENT - [place]/AT, sclera anicteric,  Neck - supple, no rigidity Skin-the diffuse confluent area of erythema in the posterior neck region extending down the posterior upper back towards the left shoulder and also slightly onto the anterior chest wall towards the shoulder was much less with the erythema less fiery red and the scattered vesicular lesions are now scabbed over., with most on the posterior neck and upper back. A small whitehead was present on the post left shoulder with a few scattered pustular lesions noted, none with marked surrounding erythema, nor any induration. A small abrasion was present on the left proximal anterior thigh (he noted was more pustular, and opened and drained), with no marked surrounding erythema, no induration, and was nontender. Neuro/psychiatric - affect was not flat, appropriate with conversation  Alert and oriented  Speech normal   Results for orders placed or performed in visit on 09/22/18  Novel Coronavirus, NAA ([facility])  Result Value Ref Range   SARS-CoV-2, NAA Not Detected Not Detected       Assessment & Plan:   1. Dermatitis Still feel the likely source of this rash was a contact dermatitis/burn injury related to applying a heating pad over the muscle salve he was using, with the possibility of zoster entertained and Valtrex prescribed to treat and it seems to have helped in improvement per the patient.  Overall, the rash area looks much improved today, and still will take some time to heal further.  Also noted the possibility of some scarring that will likely occur, and he was understanding of that. Continue with good wound care, with keeping the area clean, not feel he needs to apply the bacitracin product to the larger rash area which is better, and can use as needed if any focal areas of concern arise as we discussed. For the wound on his left anterior thigh, would keep clean with warm soapy water, and can apply the bacitracin  product topically once daily to this area and keep covered with a loose gauze dressing recommended, to protect from rubbing with the overlying jeans or other clothing. Emphasized continue close monitoring, and if redness is increasing surrounding the area on the thigh or any induration develops, or if more concerning areas arise in the area of the larger rash, or if any fevers or arising in combination, he needs to be seen immediately, and noting his MRSA history  as well. Do not feel adding antibiotics is indicated at this point and explained why, and he was in agreement and understanding of this.   2. Elevated BP without diagnosis of hypertension His blood pressure today was better, and will continue to monitor.         Towanda Malkin, MD 07/13/19 11:00 AM

## 2019-07-21 ENCOUNTER — Ambulatory Visit: Payer: Medicare Other

## 2019-07-26 ENCOUNTER — Ambulatory Visit: Payer: Medicare Other

## 2019-08-02 ENCOUNTER — Ambulatory Visit: Payer: Medicare Other

## 2019-08-04 ENCOUNTER — Ambulatory Visit: Payer: Medicare Other

## 2019-10-08 ENCOUNTER — Encounter: Payer: Medicare Other | Admitting: Family Medicine

## 2019-11-04 ENCOUNTER — Telehealth: Payer: Self-pay | Admitting: Internal Medicine

## 2019-11-04 NOTE — Progress Notes (Signed)
Patient ID: Tanner [name], male    DOB: [birth year], 69 y.o.   MRN: [id number]  PCP: Alba Cory, MD  Chief Complaint  Patient presents with  . Recurrent Skin Infections    right side of thigh, onset 1 week.  Went to urgent care was given antibiotic.  Still red, hot to touch an has some pus    Subjective:   Tanner [name] is a 69 y.o. male, presents to clinic with CC of the following:  Chief Complaint  Patient presents with  . Recurrent Skin Infections    right side of thigh, onset 1 week.  Went to urgent care was given antibiotic.  Still red, hot to touch an has some pus    HPI:  Patient is a 69 year old patient of Dr. Carlynn Purl Was seen in urgent care with concerns for MRSA noted He follows up today to reassess.  He saw urgent care on 11/01/2019, with an abscess in his right inner thigh slightly distal to the groin area, and he noted at that time it was very red on the leg distal and painful.  He notes he had a similar abscess on his left inner thigh area which required hospitalization and drainage with tubes placed to help continue to drain.  He still has a scar from that one.  Urgent care placed him on Bactrim DS 1 tablet twice daily for 14 days, and instructed to keep the area clean.  Also apply warm compresses which he has been doing. He notes the redness has gone away except immediately around the raised painful area which has not drained to date externally. He denies any fevers, not feeling ill. He is here with his wife.  Patient Active Problem List   Diagnosis Date Noted  . Post-traumatic osteoarthritis of left knee 04/21/2018      Current Outpatient Medications:  Marland Kitchen  Multiple Vitamins-Minerals (MENS MULTIVITAMIN PO), Take by mouth., Disp: , Rfl:  .  sulfamethoxazole-trimethoprim (BACTRIM DS) 800-160 MG tablet, Take 1 tablet by mouth 2 (two) times daily., Disp: , Rfl:    No Known Allergies   Past Surgical History:  Procedure Laterality Date  . INCISION AND  DRAINAGE OF WOUND Left 2006   left thigh  . IRRIGATION AND DEBRIDEMENT FOOT Right 02/24/2015   Procedure: IRRIGATION AND DEBRIDEMENT FOOT;  Surgeon: Gwyneth Revels, DPM;  Location: [facility] ORS;  Service: Podiatry;  Laterality: Right;     Family History  Problem Relation Age of Onset  . Diabetes Father      Social History   Tobacco Use  . Smoking status: Never Smoker  . Smokeless tobacco: Never Used  Substance Use Topics  . Alcohol use: Yes    Alcohol/week: 0.0 standard drinks    Comment: occassional    With staff assistance, above reviewed with the patient today.  ROS: As per HPI, otherwise no specific complaints on a limited and focused system review   No results found for this or any previous visit (from the past 72 hour(s)).   PHQ2/9: Depression screen [facility] 2/9 11/05/2019 07/13/2019 06/30/2019 06/25/2019 09/22/2018  Decreased Interest 0 0 0 0 0  Down, Depressed, Hopeless 0 0 0 0 0  PHQ - 2 Score 0 0 0 0 0  Altered sleeping 0 0 2 0 0  Tired, decreased energy 0 0 1 0 0  Change in appetite 0 0 0 0 0  Feeling bad or failure about yourself  0 0 0 0 0  Trouble concentrating  0 0 0 0 0  Moving slowly or fidgety/restless 0 0 0 0 0  Suicidal thoughts 0 0 0 0 0  PHQ-9 Score 0 0 3 0 0  Difficult doing work/chores Not difficult at all Not difficult at all Not difficult at all Not difficult at all Not difficult at all   PHQ-2/9 Result is neg   Fall Risk: Fall Risk  11/05/2019 07/13/2019 06/30/2019 06/25/2019 09/22/2018  Falls in the past year? 0 0 0 0 0  Number falls in past yr: 0 0 0 0 0  Injury with Fall? 0 0 0 0 0      Objective:   Vitals:   11/05/19 0837  BP: 134/76  Pulse: 100  Resp: 16  Temp: 98.3 F (36.8 C)  SpO2: 98%  Weight: 159 lb 8 oz (72.3 kg)  Height: 5\' 3"  (1.6 m)    Body mass index is 28.25 kg/m.  Physical Exam   NAD, masked, looks well, very pleasant HEENT - [place]/AT, sclera anicteric, Neck - supple,  Car - RRR,  Skin-an erythematous, raised area that was  present on the upper right inner thigh with the raised area approximately quarter sized and firm.  There was some surrounding erythema and induration to this raised central area.  It was mildly tender to palpate in this area, with no drainage able to be expressed with pressure surrounding.  There was no marked redness extending down the inner thigh (like he had noted a few days back when first seen).  No red streaks.  No bruising present. Ext - no LE edema distal. Neuro/psychiatric - affect was not flat, appropriate with conversation  Alert with speech normal  Grossly nonfocal    Results for orders placed or performed in visit on 09/22/18  Novel Coronavirus, NAA ([facility])  Result Value Ref Range   SARS-CoV-2, NAA Not Detected Not Detected       Assessment & Plan:   1. Abscess of right lower extremity 2. History of methicillin resistant staphylococcus aureus (MRSA) Educated patient, and recommended an incision and drainage procedure to help this heal and improve symptoms.  He noted that is why he presented today in hopes of getting it drained.  Discussed the risk and benefits of the procedure, and verbal consent obtained to proceed.  He has no allergies to lidocaine, and noted will involve a local anesthetic-lidocaine and he was in agreement with proceeding.  Procedure Note: Informed consent obtained verbally, risks and benefits of procedure discussed with patient,  The area was cleansed with alcohol and betadyne The site was locally anesthetized with approx  1 cc of 1% Lidocaine A small incision was made using a scalpel  A significant amount of  purulence drained from the area mixed with some bloody purulent material at times, with pressure surrounding the wound done to help drain the purulence He tolerated the procedure well and the incision was not large enough to apply packing today The wound was cleansed with a sterile saline product and dressing applied, a nonadherent pad and gauze  over top with tape applied to keep in place  There was still some mild induration present after the I&D was performed, and emphasized to the patient to continue with warm compresses several times daily for the next few days and to keep wound clean with warm soapy water and to keep loosely covered as he has been doing with the gauze dressing he had in place today when presented.  Also to continue the Bactrim DS  product twice daily, and still has about 10 days left to complete.  This will cover for MRSA.  Also emphasized if increasing redness again developing, especially any streakiness down the leg developing, any fevers or feeling ill, or if wound size increasing or more painful, he needs to follow-up immediately, and to an emergency setting if more streakiness or fevers or feeling ill.  He was understanding of that.  He was appreciative of having the procedure done today and the importance of following up if this is not continuing to improve or worsening at all.      Jamelle Haring, MD 11/05/19 8:49 AM

## 2019-11-04 NOTE — Telephone Encounter (Signed)
Pt is scheduled for in the morning to see Dr Dorris Fetch to see if he is able to do this drainage. Pt understands that this procedure may not be able to be done, and a possible referral could take place.

## 2019-11-04 NOTE — Telephone Encounter (Signed)
Only if an abscess, as abscesses can be drained, and these can be caused by MRSA or other organisms. It depends on the size and location as to if can drain in this office, as limitations in supplies (especially if larger and packing required).

## 2019-11-04 NOTE — Telephone Encounter (Signed)
Pt states he has mercer on his leg and has went to UC and was told to fu with PCP if its not any better. Pt is asking if Dr Dorris Fetch is able to drain it? Please advise.

## 2019-11-05 ENCOUNTER — Ambulatory Visit (INDEPENDENT_AMBULATORY_CARE_PROVIDER_SITE_OTHER): Payer: Medicare Other | Admitting: Internal Medicine

## 2019-11-05 ENCOUNTER — Other Ambulatory Visit: Payer: Self-pay

## 2019-11-05 ENCOUNTER — Encounter: Payer: Self-pay | Admitting: Internal Medicine

## 2019-11-05 VITALS — BP 134/76 | HR 100 | Temp 98.3°F | Resp 16 | Ht 63.0 in | Wt 159.5 lb

## 2019-11-05 DIAGNOSIS — Z8614 Personal history of Methicillin resistant Staphylococcus aureus infection: Secondary | ICD-10-CM | POA: Insufficient documentation

## 2019-11-05 DIAGNOSIS — L02415 Cutaneous abscess of right lower limb: Secondary | ICD-10-CM

## 2019-11-05 NOTE — Patient Instructions (Signed)
Keep the wound area clean with warm soapy water and loosely covered Continue to apply warm compresses liberally several times a day Continue the antibiotic until finished

## 2019-12-27 ENCOUNTER — Encounter: Payer: Medicare Other | Admitting: Family Medicine

## 2020-09-13 ENCOUNTER — Telehealth: Payer: Self-pay | Admitting: Family Medicine

## 2020-09-13 NOTE — Telephone Encounter (Signed)
Copied from CRM [id number]. Topic: Medicare AWV >> Sep 13, 2020 11:02 AM Claudette Laws R wrote: Reason for CRM:   Left message for patient to call back and schedule Medicare Annual Wellness Visit (AWV) in office.   If unable to come into the office for AWV,  please offer to do virtually or by telephone.  No hx of AWV eligible for AWVI as of 12/29/2019   Please schedule at anytime with [facility] Health Advisor.      40 Minutes appointment   Any questions, please call me at [phone]

## 2020-09-26 ENCOUNTER — Ambulatory Visit: Payer: Medicare Other

## 2020-11-28 ENCOUNTER — Telehealth: Payer: Self-pay | Admitting: Family Medicine

## 2020-11-28 NOTE — Telephone Encounter (Signed)
Copied from CRM [id number]. Topic: Medicare AWV >> Nov 28, 2020  3:18 PM Claudette Laws R wrote: Reason for CRM:   Left message for patient to call back and schedule Medicare Annual Wellness Visit (AWV) in office.   If unable to come into the office for AWV,  please offer to do virtually or by telephone.  No hx of AWV eligible for AWVI as of  12/29/2019 awvi  Please schedule at anytime with [facility] Health Advisor.      40 Minutes appointment   Any questions, please call me at [phone]

## 2021-03-21 ENCOUNTER — Ambulatory Visit: Payer: Self-pay | Admitting: *Deleted

## 2021-03-21 ENCOUNTER — Telehealth (INDEPENDENT_AMBULATORY_CARE_PROVIDER_SITE_OTHER): Payer: Medicare Other | Admitting: Nurse Practitioner

## 2021-03-21 ENCOUNTER — Encounter: Payer: Self-pay | Admitting: Nurse Practitioner

## 2021-03-21 ENCOUNTER — Other Ambulatory Visit: Payer: Self-pay

## 2021-03-21 DIAGNOSIS — J0140 Acute pansinusitis, unspecified: Secondary | ICD-10-CM

## 2021-03-21 DIAGNOSIS — R051 Acute cough: Secondary | ICD-10-CM

## 2021-03-21 MED ORDER — BENZONATATE 100 MG PO CAPS: 200.0000 mg | ORAL_CAPSULE | Freq: Two times a day (BID) | ORAL | 0 refills | Status: DC | PRN

## 2021-03-21 MED ORDER — AMOXICILLIN-POT CLAVULANATE 875-125 MG PO TABS: 1.0000 | ORAL_TABLET | Freq: Two times a day (BID) | ORAL | 0 refills | Status: AC

## 2021-03-21 NOTE — Telephone Encounter (Signed)
Pt reports productive cough, greenish phlegm, onset 2-3 days ago, worsening. Also reports SOB with exertion and at rest "All the time." States LGT last few days, none presently. Also reports right sided pain at rib area with coughing and deep breaths.HAs not covid tested, no known exposure.Advised UC, unsure pt will follow disposition. Pt initially agreed. Attempted to triage wife who has similar symptoms,wife on phone,refuses triage, see encounter.    Reason for Disposition  [1] MODERATE difficulty breathing (e.g., speaks in phrases, SOB even at rest, pulse 100-120) AND [2] still present when not coughing  Answer Assessment - Initial Assessment Questions 1. ONSET: "When did the cough begin?"      2-3 days ago 2. SEVERITY: "How bad is the cough today?"      Bad spells during day, worse at night when lying down 3. SPUTUM: "Describe the color of your sputum" (none, dry cough; clear, white, yellow, green)     Greenish 4. HEMOPTYSIS: "Are you coughing up any blood?" If so ask: "How much?" (flecks, streaks, tablespoons, etc.)     No 5. DIFFICULTY BREATHING: "Are you having difficulty breathing?" If Yes, ask: "How bad is it?" (e.g., mild, moderate, severe)    - MILD: No SOB at rest, mild SOB with walking, speaks normally in sentences, can lie down, no retractions, pulse < 100.    - MODERATE: SOB at rest, SOB with minimal exertion and prefers to sit, cannot lie down flat, speaks in phrases, mild retractions, audible wheezing, pulse 100-120.    - SEVERE: Very SOB at rest, speaks in single words, struggling to breathe, sitting hunched forward, retractions, pulse > 120      "All the time"  6. FEVER: "Do you have a fever?" If Yes, ask: "What is your temperature, how was it measured, and when did it start?"     Not this AM. LGT few days 7. CARDIAC HISTORY: "Do you have any history of heart disease?" (e.g., heart attack, congestive heart failure)       8. LUNG HISTORY: "Do you have any history of lung  disease?"  (e.g., pulmonary embolus, asthma, emphysema)      9. PE RISK FACTORS: "Do you have a history of blood clots?" (or: recent major surgery, recent prolonged travel, bedridden)      10. OTHER SYMPTOMS: "Do you have any other symptoms?" (e.g., runny nose, wheezing, chest pain)       No wheezing, right sided rib area in front pain when coughing  Protocols used: Cough - Acute Productive-A-AH

## 2021-03-21 NOTE — Progress Notes (Signed)
Name: Tanner [name]   MRN: [id number]    DOB: May 02, 1950   Date:03/21/2021       Progress Note  Subjective  Chief Complaint  Chief Complaint  Patient presents with   URI    Cough, congested    I connected with  Gina R Castner  on 03/21/21 at  3:00 PM EST by a video enabled telemedicine application and verified that I am speaking with the correct person using two identifiers.  I discussed the limitations of evaluation and management by telemedicine and the availability of in person appointments. The patient expressed understanding and agreed to proceed with a virtual visit  Staff also discussed with the patient that there may be a patient responsible charge related to this service. Patient Location: home Provider Location: [facility] Additional Individuals present: wife  HPI  Sinus infection: He says he has had nasal congestion for about a week.  He said he thinks he may have have a low grade fever yesterday.  He says that his face feels tender and swollen.  He says he is blowing out thick green mucous.  He also reports a headache.  He says he has been taking Claritin, and Flonase.  He says he is concerned that he has a sinus infections since his nasal congestion has gotten worse and not better.  Will prescribe antibiotics.  Discussed OTC treatments for symptoms.  Push fluids  Cough: He says he started coughing about five days ago.  He is not interested in Covid or Flu testing.  He denies any shortness of breath or chest pain.  Discussed OTC treatments for symptoms.  Will prescribe tessalon perls. Push fluids  Patient Active Problem List   Diagnosis Date Noted   Abscess of right lower extremity 11/05/2019   History of methicillin resistant staphylococcus aureus (MRSA) 11/05/2019   Post-traumatic osteoarthritis of left knee 04/21/2018    Social History   Tobacco Use   Smoking status: Never   Smokeless tobacco: Never  Substance Use Topics   Alcohol use: Yes    Alcohol/week: 0.0  standard drinks    Comment: occassional     Current Outpatient Medications:    amoxicillin-clavulanate (AUGMENTIN) 875-125 MG tablet, Take 1 tablet by mouth 2 (two) times daily for 10 days., Disp: 20 tablet, Rfl: 0   benzonatate (TESSALON) 100 MG capsule, Take 2 capsules (200 mg total) by mouth 2 (two) times daily as needed for cough., Disp: 20 capsule, Rfl: 0   Multiple Vitamins-Minerals (MENS MULTIVITAMIN PO), Take by mouth., Disp: , Rfl:   No Known Allergies  I personally reviewed active problem list, medication list, allergies with the patient/caregiver today.  ROS  Constitutional: Negative for fever or weight change.  HEENT: Positive nasal congestion, facial tenderness Respiratory: Positive for cough, negative for shortness of breath.   Cardiovascular: Negative for chest pain or palpitations.  Gastrointestinal: Negative for abdominal pain, no bowel changes.  Musculoskeletal: Negative for gait problem or joint swelling.  Skin: Negative for rash.  Neurological: Negative for dizziness, positive for headache.  No other specific complaints in a complete review of systems (except as listed in HPI above).   Objective  Virtual encounter, vitals not obtained.  There is no height or weight on file to calculate BMI.  Nursing Note and Vital Signs reviewed.  Physical Exam  Awake, alert and oriented, speaking in complete sentences  No results found for this or any previous visit (from the past 72 hour(s)).  Assessment & Plan  1. Acute  non-recurrent pansinusitis -continue taking OTC treatments for symptoms - amoxicillin-clavulanate (AUGMENTIN) 875-125 MG tablet; Take 1 tablet by mouth 2 (two) times daily for 10 days.  Dispense: 20 tablet; Refill: 0  2. Acute cough -continue taking OTC treatments for symptoms - benzonatate (TESSALON) 100 MG capsule; Take 2 capsules (200 mg total) by mouth 2 (two) times daily as needed for cough.  Dispense: 20 capsule; Refill: 0   -Red flags  and when to present for emergency care or RTC including fever >101.38F, chest pain, shortness of breath, new/worsening/un-resolving symptoms, reviewed with patient at time of visit. Follow up and care instructions discussed and provided in AVS. - I discussed the assessment and treatment plan with the patient. The patient was provided an opportunity to ask questions and all were answered. The patient agreed with the plan and demonstrated an understanding of the instructions.  I provided 15 minutes of non-face-to-face time during this encounter.  Berniece Salines, FNP

## 2021-11-06 ENCOUNTER — Encounter: Payer: Self-pay | Admitting: Family Medicine

## 2021-11-06 ENCOUNTER — Telehealth (INDEPENDENT_AMBULATORY_CARE_PROVIDER_SITE_OTHER): Payer: Medicare Other | Admitting: Family Medicine

## 2021-11-06 DIAGNOSIS — U071 COVID-19: Secondary | ICD-10-CM | POA: Diagnosis not present

## 2021-11-06 MED ORDER — NIRMATRELVIR/RITONAVIR (PAXLOVID)TABLET: 3.0000 | ORAL_TABLET | Freq: Two times a day (BID) | ORAL | 0 refills | Status: AC

## 2021-11-06 NOTE — Progress Notes (Signed)
Virtual Visit via Phone Note  I connected with Tanner [name] on 11/06/21 at 11:00 AM EDT by phone and verified that I am speaking with the correct person using two identifiers.  Location: Patient: home Provider: [facility]   I discussed the limitations of evaluation and management by telemedicine and the availability of in person appointments. The patient expressed understanding and agreed to proceed.  History of Present Illness:  UPPER RESPIRATORY TRACT INFECTION - symptom onset Sunday - COVID+ yesterday - body aches, headache, Loss of appetite, chills  Fever: no Cough: yes Shortness of breath: no Chest pain: no Chest tightness:  a little Chest congestion: yes Nasal congestion: yes Runny nose:  a little Headache: yesVomiting: no Treatments attempted: tylenol, claritin     Observations/Objective:  Patient had trouble connecting to video visit, entirety of visit conducted over the phone.  Speaks in full sentences, no respiratory distress.   Assessment and Plan:  COVID-19 Doing well with mild sx. Is a candidate for COVID treatment, paxlovid sent to pharmacy. No prior kidney dysfunction but last creatinine >3 years ago, will obtain updated Cr. Reviewed OTC symptom relief, self-quarantine guidelines, and emergency precautions.     I discussed the assessment and treatment plan with the patient. The patient was provided an opportunity to ask questions and all were answered. The patient agreed with the plan and demonstrated an understanding of the instructions.   The patient was advised to call back or seek an in-person evaluation if the symptoms worsen or if the condition fails to improve as anticipated.  I provided 7 minutes of non-face-to-face time during this encounter.   Caro Laroche, DO

## 2021-11-06 NOTE — Patient Instructions (Addendum)
It was great to see you!  Our plans for today:  - See below for self-isolation guidelines. You may end your quarantine once you are 10 days from symptom onset and fever free for 24 hours without use of tylenol or ibuprofen. Wear a well-fitting N95 mask if you have to go out and about. - Take the paxlovid as directed. See http://galloway.com/ for more information about the medication. - I recommend getting vaccinated with your booster once you are healed from your current infection. - Certainly, if you are having difficulties breathing or unable to keep down fluids, go to the Emergency Department.   Take care and seek immediate care sooner if you develop any concerns.   Dr. Linwood Dibbles     Person Under Monitoring Name: Tanner [name]  Location: [address] [place] [address]   Infection Prevention Recommendations for Individuals Confirmed to have, or Being Evaluated for, 2019 Novel Coronavirus (COVID-19) Infection Who Receive Care at Home  Individuals who are confirmed to have, or are being evaluated for, COVID-19 should follow the prevention steps below until a healthcare provider or local or state health department says they can return to normal activities.  Stay home except to get medical care You should restrict activities outside your home, except for getting medical care. Do not go to work, school, or public areas, and do not use public transportation or taxis.  Call ahead before visiting your doctor Before your medical appointment, call the healthcare provider and tell them that you have, or are being evaluated for, COVID-19 infection. This will help the healthcare provider's office take steps to keep other people from getting infected. Ask your healthcare provider to call the local or state health department.  Monitor your symptoms Seek prompt medical attention if your illness is worsening (e.g., difficulty breathing). Before going to your  medical appointment, call the healthcare provider and tell them that you have, or are being evaluated for, COVID-19 infection. Ask your healthcare provider to call the local or state health department.  Wear a facemask You should wear a facemask that covers your nose and mouth when you are in the same room with other people and when you visit a healthcare provider. People who live with or visit you should also wear a facemask while they are in the same room with you.  Separate yourself from other people in your home As much as possible, you should stay in a different room from other people in your home. Also, you should use a separate bathroom, if available.  Avoid sharing household items You should not share dishes, drinking glasses, cups, eating utensils, towels, bedding, or other items with other people in your home. After using these items, you should wash them thoroughly with soap and water.  Cover your coughs and sneezes Cover your mouth and nose with a tissue when you cough or sneeze, or you can cough or sneeze into your sleeve. Throw used tissues in a lined trash can, and immediately wash your hands with soap and water for at least 20 seconds or use an alcohol-based hand rub.  Wash your [employer] your hands often and thoroughly with soap and water for at least 20 seconds. You can use an alcohol-based hand sanitizer if soap and water are not available and if your hands are not visibly dirty. Avoid touching your eyes, nose, and mouth with unwashed hands.   Prevention Steps for Caregivers and Household Members of Individuals Confirmed to have, or Being Evaluated for,  COVID-19 Infection Being Cared for in the Home  If you live with, or provide care at home for, a person confirmed to have, or being evaluated for, COVID-19 infection please follow these guidelines to prevent infection:  Follow healthcare provider's instructions Make sure that you understand and can help the  patient follow any healthcare provider instructions for all care.  Provide for the patient's basic needs You should help the patient with basic needs in the home and provide support for getting groceries, prescriptions, and other personal needs.  Monitor the patient's symptoms If they are getting sicker, call his or her medical provider and tell them that the patient has, or is being evaluated for, COVID-19 infection. This will help the healthcare provider's office take steps to keep other people from getting infected. Ask the healthcare provider to call the local or state health department.  Limit the number of people who have contact with the patient If possible, have only one caregiver for the patient. Other household members should stay in another home or place of residence. If this is not possible, they should stay in another room, or be separated from the patient as much as possible. Use a separate bathroom, if available. Restrict visitors who do not have an essential need to be in the home.  Keep older adults, very young children, and other sick people away from the patient Keep older adults, very young children, and those who have compromised immune systems or chronic health conditions away from the patient. This includes people with chronic heart, lung, or kidney conditions, diabetes, and cancer.  Ensure good ventilation Make sure that shared spaces in the home have good air flow, such as from an air conditioner or an opened window, weather permitting.  Wash your hands often Wash your hands often and thoroughly with soap and water for at least 20 seconds. You can use an alcohol based hand sanitizer if soap and water are not available and if your hands are not visibly dirty. Avoid touching your eyes, nose, and mouth with unwashed hands. Use disposable paper towels to dry your hands. If not available, use dedicated cloth towels and replace them when they become wet.  Wear a  facemask and gloves Wear a disposable facemask at all times in the room and gloves when you touch or have contact with the patient's blood, body fluids, and/or secretions or excretions, such as sweat, saliva, sputum, nasal mucus, vomit, urine, or feces.  Ensure the mask fits over your nose and mouth tightly, and do not touch it during use. Throw out disposable facemasks and gloves after using them. Do not reuse. Wash your hands immediately after removing your facemask and gloves. If your personal clothing becomes contaminated, carefully remove clothing and launder. Wash your hands after handling contaminated clothing. Place all used disposable facemasks, gloves, and other waste in a lined container before disposing them with other household waste. Remove gloves and wash your hands immediately after handling these items.  Do not share dishes, glasses, or other household items with the patient Avoid sharing household items. You should not share dishes, drinking glasses, cups, eating utensils, towels, bedding, or other items with a patient who is confirmed to have, or being evaluated for, COVID-19 infection. After the person uses these items, you should wash them thoroughly with soap and water.  Wash laundry thoroughly Immediately remove and wash clothes or bedding that have blood, body fluids, and/or secretions or excretions, such as sweat, saliva, sputum, nasal mucus, vomit, urine, or  feces, on them. Wear gloves when handling laundry from the patient. Read and follow directions on labels of laundry or clothing items and detergent. In general, wash and dry with the warmest temperatures recommended on the label.  Clean all areas the individual has used often Clean all touchable surfaces, such as counters, tabletops, doorknobs, bathroom fixtures, toilets, phones, keyboards, tablets, and bedside tables, every day. Also, clean any surfaces that may have blood, body fluids, and/or secretions or excretions  on them. Wear gloves when cleaning surfaces the patient has come in contact with. Use a diluted bleach solution (e.g., dilute bleach with 1 part bleach and 10 parts water) or a household disinfectant with a label that says EPA-registered for coronaviruses. To make a bleach solution at home, add 1 tablespoon of bleach to 1 quart (4 cups) of water. For a larger supply, add  cup of bleach to 1 gallon (16 cups) of water. Read labels of cleaning products and follow recommendations provided on product labels. Labels contain instructions for safe and effective use of the cleaning product including precautions you should take when applying the product, such as wearing gloves or eye protection and making sure you have good ventilation during use of the product. Remove gloves and wash hands immediately after cleaning.  Monitor yourself for signs and symptoms of illness Caregivers and household members are considered close contacts, should monitor their health, and will be asked to limit movement outside of the home to the extent possible. Follow the monitoring steps for close contacts listed on the symptom monitoring form.   ? If you have additional questions, contact your local health department or call the epidemiologist on call at [phone] (available 24/7). ? This guidance is subject to change. For the most up-to-date guidance from [facility], please refer to their website: YouBlogs.pl

## 2021-11-07 LAB — BASIC METABOLIC PANEL
BUN/Creatinine Ratio: 9 (calc) (ref 6–22)
BUN: 13 mg/dL (ref 7–25)
CO2: 25 mmol/L (ref 20–32)
Calcium: 9.7 mg/dL (ref 8.6–10.3)
Chloride: 97 mmol/L — ABNORMAL LOW (ref 98–110)
Creat: 1.43 mg/dL — ABNORMAL HIGH (ref 0.70–1.28)
Glucose, Bld: 143 mg/dL — ABNORMAL HIGH (ref 65–99)
Potassium: 4.2 mmol/L (ref 3.5–5.3)
Sodium: 135 mmol/L (ref 135–146)

## 2024-01-05 ENCOUNTER — Ambulatory Visit: Payer: Self-pay

## 2024-01-05 NOTE — Telephone Encounter (Signed)
 FYI Only or Action Required?: FYI only for provider.  Patient was last seen in primary care on 11/06/2021 by Rumball, Alison M, DO.  Called Nurse Triage reporting Rash.  Symptoms began several weeks ago.  Interventions attempted: Rest, hydration, or home remedies.  Symptoms are: unchanged.  Triage Disposition: See Physician Within 24 Hours  Patient/caregiver understands and will follow disposition?: Yes  **Appt. Scheduled with PCP for 9/9**              Copied from CRM #[id number]. Topic: Clinical - Red Word Triage >> Jan 05, 2024 10:57 AM Willma R wrote: [facility] that prompted transfer to Nurse Triage: Patient has an itchy rash on his neck that has spread to his leg for the last 3 weeks. Has gone to the UC twice and has been prescribed 2 rounds of predniSONE  (STERAPRED UNI-PAK 21 TAB) 10 MG (21) TBPK tablet, but the rash has come back on his neck. Every time he stops the medicine it comes back. Reason for Disposition  SEVERE itching (i.e., interferes with sleep, normal activities or school)  Answer Assessment - Initial Assessment Questions 1. APPEARANCE of RASH: What does the rash look like? (e.g., blisters, dry flaky skin, red spots, redness, sores)      Raised bumps, redness, welts  2. SIZE: How big are the spots? (e.g., tip of pen, eraser, coin; inches, centimeters)     Varies in size, tip of pen  3. LOCATION: Where is the rash located?     Neck, right leg  4. COLOR: What color is the rash? (Note: It is difficult to assess rash color in people with darker-colored skin. When this situation occurs, simply ask the caller to describe what they see.)     Red  5. ONSET: When did the rash begin?     X 3 weeks   6. FEVER: Do you have a fever? If Yes, ask: What is your temperature, how was it measured, and when did it start?    No   7. ITCHING: Does the rash itch? If Yes, ask: How bad is the itch? (Scale 1-10; or mild, moderate, severe)     Yes,  mild-moderate, Cortizone cream applied, some relief noted  8. CAUSE: What do you think is causing the rash?    Unknown   9. MEDICINE FACTORS: Have you started any new medicines within the last 2 weeks? (e.g., antibiotics)      Rx. Prednisone  injection given  10. OTHER SYMPTOMS: Do you have any other symptoms? (e.g., dizziness, headache, sore throat, joint pain)  No   Seen in UC and was given Prednisone  injection 12/06/23. Rash has returned  Protocols used: Rash or Redness - [facility]

## 2024-01-06 ENCOUNTER — Encounter: Payer: Self-pay | Admitting: Family Medicine

## 2024-01-06 ENCOUNTER — Ambulatory Visit (INDEPENDENT_AMBULATORY_CARE_PROVIDER_SITE_OTHER): Admitting: Family Medicine

## 2024-01-06 VITALS — BP 140/86 | HR 97 | Resp 16 | Ht 63.0 in | Wt 172.5 lb

## 2024-01-06 DIAGNOSIS — R21 Rash and other nonspecific skin eruption: Secondary | ICD-10-CM | POA: Diagnosis not present

## 2024-01-06 DIAGNOSIS — R739 Hyperglycemia, unspecified: Secondary | ICD-10-CM

## 2024-01-06 DIAGNOSIS — R030 Elevated blood-pressure reading, without diagnosis of hypertension: Secondary | ICD-10-CM

## 2024-01-06 DIAGNOSIS — H9201 Otalgia, right ear: Secondary | ICD-10-CM

## 2024-01-06 DIAGNOSIS — E790 Hyperuricemia without signs of inflammatory arthritis and tophaceous disease: Secondary | ICD-10-CM | POA: Diagnosis not present

## 2024-01-06 DIAGNOSIS — M109 Gout, unspecified: Secondary | ICD-10-CM

## 2024-01-06 DIAGNOSIS — I10 Essential (primary) hypertension: Secondary | ICD-10-CM | POA: Insufficient documentation

## 2024-01-06 DIAGNOSIS — Z1211 Encounter for screening for malignant neoplasm of colon: Secondary | ICD-10-CM

## 2024-01-06 DIAGNOSIS — Z1159 Encounter for screening for other viral diseases: Secondary | ICD-10-CM

## 2024-01-06 MED ORDER — TRIAMCINOLONE ACETONIDE 0.1 % EX CREA: 1.0000 | TOPICAL_CREAM | Freq: Two times a day (BID) | CUTANEOUS | 0 refills | Status: AC

## 2024-01-06 MED ORDER — HYDROXYZINE HCL 10 MG PO TABS: 10.0000 mg | ORAL_TABLET | Freq: Three times a day (TID) | ORAL | 0 refills | Status: DC | PRN

## 2024-01-06 NOTE — Progress Notes (Signed)
 Name: Tanner [name]   MRN: [id number]    DOB: 1951/03/20   Date:01/06/2024       Progress Note  Subjective  Chief Complaint  Chief Complaint  Patient presents with   Rash    Patient has an itchy rash on his neck that has spread to his leg for the last 3 weeks. Has gone to the UC twice and has been prescribed 2 rounds of predniSONE  (STERAPRED UNI-PAK 21 TAB) 10 MG (21) TBPK tablet, but the rash has come back on his neck. Every time he stops the medicine it comes back.    Discussed the use of AI scribe software for clinical note transcription with the patient, who gave verbal consent to proceed.  History of Present Illness Tanner [name] is a 73 year old male who presents with a recurrent rash.  The rash initially appeared in early August after he worked in the yard and [employer]. He suspected a spider bite as the cause. It was treated at a walk-in clinic with a prednisone  injection and a prescription for oral prednisone , which temporarily cleared the rash. However, the rash returned after completing the prednisone  course.  The rash is itchy, bumpy, and dry, primarily located on the neck and trunk, with initial involvement of the leg that did not recur. He uses over-the-counter hydrocortisone 10 for itching, which provides some relief. He does not use any antihistamines like loratadine or Allegra. He avoids scratching during the day but may scratch at night unknowingly.  He mentions a sore sensation inside his right ear that started two to three days ago, which he is unsure if it is related to the rash.  He has a history of gout, with minor issues primarily affecting his toe, but he has not taken any preventive medication for it. He also notes a history of elevated blood sugar levels and blood pressure readings, with past readings showing borderline hypertension.  No significant weight loss, lack of appetite, chest pain, palpitations, or shortness of breath. He wants to lose weight but reports  stable weight.  He recalls a change in laundry detergent that may have coincided with the rash's onset, which he has since discontinued.    Patient Active Problem List   Diagnosis Date Noted   Hypertension, benign 01/06/2024   Abscess of right lower extremity 11/05/2019   History of methicillin resistant staphylococcus aureus (MRSA) 11/05/2019   Post-traumatic osteoarthritis of left knee 04/21/2018    Social History   Tobacco Use   Smoking status: Never   Smokeless tobacco: Never  Substance Use Topics   Alcohol use: Yes    Alcohol/week: 0.0 standard drinks of alcohol    Comment: occassional     Current Outpatient Medications:    hydrOXYzine  (ATARAX ) 10 MG tablet, Take 1 tablet (10 mg total) by mouth 3 (three) times daily as needed., Disp: 30 tablet, Rfl: 0   Multiple Vitamins-Minerals (MENS MULTIVITAMIN PO), Take by mouth., Disp: , Rfl:    triamcinolone  cream (KENALOG ) 0.1 %, Apply 1 Application topically 2 (two) times daily., Disp: 453.6 g, Rfl: 0  No Known Allergies  ROS  Ten systems reviewed and is negative except as mentioned in HPI    Objective  Vitals:   01/06/24 1254 01/06/24 1325  BP: (!) 152/90 (!) 140/86  Pulse: 97   Resp: 16   SpO2: 98%   Weight: 172 lb 8 oz (78.2 kg)   Height: 5' 3 (1.6 m)     Body mass index is  30.56 kg/m.   Physical Exam  Constitutional: Patient appears well-developed and well-nourished.  No distress.  HEENT: head atraumatic, normocephalic, pupils equal and reactive to light, neck supple, normal ear exam Cardiovascular: Normal rate, regular rhythm and normal heart sounds.  No murmur heard. No BLE edema. Pulmonary/Chest: Effort normal and breath sounds normal. No respiratory distress. Abdominal: Soft.  There is no tenderness. Skin: erythematous papules on anterior chest, around neck  Psychiatric: Patient has a normal mood and affect. behavior is normal. Judgment and thought content normal.   Assessment & Plan Pruritic rash  of trunk and neck Recurrent pruritic rash on trunk and neck, improved with prednisone . Rash is bumpy, dry, itchy. Recent detergent change may be a factor. - Order blood tests: CRP, ESR, CBC, glucose, kidney function, RPR. - Prescribe hydroxyzine  10 mg for nighttime itching. - Prescribe triamcinolone  1% cream mixed with unscented lotion, avoiding face, axilla, groin. - Advise discontinuation of new detergents or products.  Gout and hyperuricemia Sporadic gout episodes, primarily affecting the toe, with elevated uric acid levels. No current medication for prevention or acute treatment. - Order uric acid level. - Discuss as-needed medication for acute gout attacks if episodes increase.  Elevated blood pressure and essential hypertension, monitoring Variable blood pressure readings with some past elevations. Current readings within normal range, requiring monitoring for hypertension pattern. - Monitor blood pressure regularly. - Consider follow-up in six weeks to evaluate trends.  General Health Maintenance Preventive care discussed, including colon cancer screening and vaccinations. No colon cancer screening history. Vaccinations for pneumonia, shingles, and flu discussed. Recent flu shot received. - Order Cologuard for colon cancer screening. - Advise obtaining shingles vaccine at pharmacy. - Administer pneumonia and flu vaccines at next visit in six weeks.  Follow-Up Follow-up visit planned to reassess rash, blood pressure, and general health maintenance. - Schedule follow-up appointment in six weeks to review lab results, blood pressure monitoring, and administer vaccines.

## 2024-01-07 ENCOUNTER — Ambulatory Visit: Payer: Self-pay | Admitting: Family Medicine

## 2024-01-08 ENCOUNTER — Other Ambulatory Visit: Payer: Self-pay

## 2024-01-08 DIAGNOSIS — R739 Hyperglycemia, unspecified: Secondary | ICD-10-CM

## 2024-01-08 LAB — COMPREHENSIVE METABOLIC PANEL WITH GFR
AG Ratio: 1.7 (calc) (ref 1.0–2.5)
ALT: 24 U/L (ref 9–46)
AST: 25 U/L (ref 10–35)
Albumin: 4.8 g/dL (ref 3.6–5.1)
Alkaline phosphatase (APISO): 71 U/L (ref 35–144)
BUN: 15 mg/dL (ref 7–25)
CO2: 26 mmol/L (ref 20–32)
Calcium: 10.8 mg/dL — ABNORMAL HIGH (ref 8.6–10.3)
Chloride: 103 mmol/L (ref 98–110)
Creat: 0.87 mg/dL (ref 0.70–1.28)
Globulin: 2.9 g/dL (ref 1.9–3.7)
Glucose, Bld: 113 mg/dL — ABNORMAL HIGH (ref 65–99)
Potassium: 4.4 mmol/L (ref 3.5–5.3)
Sodium: 138 mmol/L (ref 135–146)
Total Bilirubin: 0.5 mg/dL (ref 0.2–1.2)
Total Protein: 7.7 g/dL (ref 6.1–8.1)
eGFR: 92 mL/min/1.73m2 (ref >=60)

## 2024-01-08 LAB — CBC WITH DIFFERENTIAL/PLATELET
Absolute Lymphocytes: 1391 {cells}/uL (ref 850–3900)
Absolute Monocytes: 592 {cells}/uL (ref 200–950)
Basophils Absolute: 39 {cells}/uL (ref 0–200)
Basophils Relative: 0.6 %
Eosinophils Absolute: 260 {cells}/uL (ref 15–500)
Eosinophils Relative: 4 %
HCT: 47.7 % (ref 38.5–50.0)
Hemoglobin: 15.9 g/dL (ref 13.2–17.1)
MCH: 30.9 pg (ref 27.0–33.0)
MCHC: 33.3 g/dL (ref 32.0–36.0)
MCV: 92.6 fL (ref 80.0–100.0)
MPV: 10 fL (ref 7.5–12.5)
Monocytes Relative: 9.1 %
Neutro Abs: 4219 {cells}/uL (ref 1500–7800)
Neutrophils Relative %: 64.9 %
Platelets: 246 Thousand/uL (ref 140–400)
RBC: 5.15 Million/uL (ref 4.20–5.80)
RDW: 12.2 % (ref 11.0–15.0)
Total Lymphocyte: 21.4 %
WBC: 6.5 Thousand/uL (ref 3.8–10.8)

## 2024-01-08 LAB — C-REACTIVE PROTEIN: CRP: 3.7 mg/L (ref <8.0)

## 2024-01-08 LAB — HEPATITIS C ANTIBODY: Hepatitis C Ab: NONREACTIVE

## 2024-01-08 LAB — RPR: RPR Ser Ql: NONREACTIVE

## 2024-01-08 LAB — HEMOGLOBIN A1C
Hgb A1c MFr Bld: 6.1 % — ABNORMAL HIGH (ref <5.7)
Mean Plasma Glucose: 128 mg/dL
eAG (mmol/L): 7.1 mmol/L

## 2024-01-08 LAB — SEDIMENTATION RATE: Sed Rate: 22 mm/h — ABNORMAL HIGH (ref 0–20)

## 2024-01-08 LAB — URIC ACID: Uric Acid, Serum: 7.3 mg/dL (ref 4.0–8.0)

## 2024-01-08 NOTE — Addendum Note (Signed)
 Addended by: RENTERIA-GARCIA, Gillermo Poch on: 01/08/2024 11:43 AM   Modules accepted: Orders

## 2024-01-10 LAB — PTH, INTACT AND CALCIUM
Calcium: 10.6 mg/dL — ABNORMAL HIGH (ref 8.6–10.3)
PTH: 92 pg/mL — ABNORMAL HIGH (ref 16–77)

## 2024-01-12 ENCOUNTER — Ambulatory Visit: Payer: Self-pay | Admitting: Family Medicine

## 2024-01-12 ENCOUNTER — Other Ambulatory Visit: Payer: Self-pay

## 2024-01-12 DIAGNOSIS — E213 Hyperparathyroidism, unspecified: Secondary | ICD-10-CM

## 2024-01-30 ENCOUNTER — Other Ambulatory Visit: Payer: Self-pay | Admitting: Family Medicine

## 2024-01-30 DIAGNOSIS — R21 Rash and other nonspecific skin eruption: Secondary | ICD-10-CM

## 2024-02-16 ENCOUNTER — Ambulatory Visit: Payer: Self-pay

## 2024-02-16 NOTE — Telephone Encounter (Signed)
 FYI Only or Action Required?: FYI only for provider.  Patient was last seen in primary care on 01/06/2024 by Tanner Mire, MD.  Called Nurse Triage reporting Rash and Wound Infection.  Symptoms began about a month ago.  Interventions attempted: Prescription medications: Kenalog  cream, atarax , keflex.  Symptoms are: gradually worsening. Rash on neck gradually worsening, wound on left leg gradually improving  Triage Disposition: See PCP When Office is Open (Within 3 Days)  Patient/caregiver understands and will follow disposition?: Yes  Copied from CRM #[id number]. Topic: Clinical - Red Word Triage >> Feb 16, 2024  2:49 PM Tanner [name] wrote: Pt has a rash that won't go away and he got a staph infection on his left leg he went to urgent care they gave him meds but it's still there and he wants to get checked out to be sure he's doing things right.  Rash on back and front of neck Reason for Disposition  Localized rash present > 7 days  Answer Assessment - Initial Assessment Questions Went to UC on Thursday after htting left leg on metal door, diagnosed with staph infection and prescribed Keflex. Reports wound has improved and still has a few days of abxs left. Has been doing wound care several times per day.   Also has rash mostly on back of neck and some on the front. Saw PCP in early September for this and was prescribed kenalog  cream and atarax . Reports it was working for a little while with some improvements but rash has come back. No difficulty breathing.  1. SYMPTOM: What's the main symptom you're concerned about? (e.g., redness, swelling, pain, fever, weakness)     Started out more red and swollen, has improved. Mild swelling and redness 1 inch out from wound.  2. WOUND INFECTION LOCATION: Where is the wound infection located? (e.g., arm, face, foot, knee, leg)     Left leg middle anterior calf  3. WOUND INFECTION SIZE: What is the size of the red area? (e.g., inches,  centimeters; compare to size of a coin)      Started out 1-2 inches, has shrunk since then.  4. BETTER-SAME-WORSE: Are you getting better, staying the same, or getting worse compared to the day you started the antibiotics?      Better  5. PAIN: Do you have any pain?  If Yes, ask: How bad is the pain?  (e.g., Scale 1-10; mild, moderate, or severe)     Started out with pain on Thursday, now no pain.  6. FEVER: Do you have a fever? If Yes, ask: What is it, how was it measured and when did it start?     No fever  7. OTHER SYMPTOMS: Do you have any other symptoms? (e.g., pus coming from a wound, red streaks, weakness)     Started out with pus drainage on Thursday, now with red/clear drainage  8. DIAGNOSIS DATE: When was the wound infection diagnosed? By whom?      Thursday 10/16, hit leg on a metal door.  9. ANTIBIOTIC NAME: What antibiotic(s) are you taking?  How many times a day? Note: Be sure the patient is taking the antibiotic as directed.      Kelfex 500 bid, has been applying triple abx oitment as well to wound  10. ANTIBIOTIC DATE: When was the antibiotic started?       02/12/24  11. FOLLOW-UP APPOINTMENT: Do you have a follow-up appointment with your doctor?       No  Answer Assessment -  Initial Assessment Questions 1. APPEARANCE of RASH: What does the rash look like? (e.g., blisters, dry flaky skin, red spots, redness, sores)     Red welt like rash on back of neck and a little on the front of the neck  2. LOCATION: Where is the rash located?      Back of neck and a little on the front of the neck  3. NUMBER: How many spots are there?      Many small welts  4. SIZE: How big are the spots? (e.g., inches, cm; or compare to size of pinhead, tip of pen, eraser, pea)      1/8 inch wide  5. ONSET: When did the rash start?      Early September   6. ITCHING: Does the rash itch? If Yes, ask: How bad is the itch?  (Scale 0-10; or none, mild,  moderate, severe)     Moderate-severe itching, using kenalog  cream and atarax . Currently out of atarax .  7. PAIN: Does the rash hurt? If Yes, ask: How bad is the pain?  (Scale 0-10; or none, mild, moderate, severe)     None  8. OTHER SYMPTOMS: Do you have any other symptoms? (e.g., fever)     No  Protocols used: Rash or Redness - Localized-[facility], Wound Infection on Antibiotic Follow-up Call-[facility]

## 2024-02-18 ENCOUNTER — Encounter: Payer: Self-pay | Admitting: Nurse Practitioner

## 2024-02-18 ENCOUNTER — Ambulatory Visit (INDEPENDENT_AMBULATORY_CARE_PROVIDER_SITE_OTHER): Admitting: Nurse Practitioner

## 2024-02-18 VITALS — BP 138/88 | HR 105 | Temp 98.0°F | Ht 63.0 in | Wt 172.0 lb

## 2024-02-18 DIAGNOSIS — Z8614 Personal history of Methicillin resistant Staphylococcus aureus infection: Secondary | ICD-10-CM

## 2024-02-18 DIAGNOSIS — R21 Rash and other nonspecific skin eruption: Secondary | ICD-10-CM

## 2024-02-18 DIAGNOSIS — L0390 Cellulitis, unspecified: Secondary | ICD-10-CM

## 2024-02-18 DIAGNOSIS — Z23 Encounter for immunization: Secondary | ICD-10-CM

## 2024-02-18 MED ORDER — FAMOTIDINE 20 MG PO TABS: 20.0000 mg | ORAL_TABLET | Freq: Two times a day (BID) | ORAL | 0 refills | Status: AC

## 2024-02-18 MED ORDER — HYDROXYZINE HCL 10 MG PO TABS: 10.0000 mg | ORAL_TABLET | Freq: Three times a day (TID) | ORAL | 0 refills | Status: AC | PRN

## 2024-02-18 MED ORDER — CEPHALEXIN 500 MG PO CAPS: 500.0000 mg | ORAL_CAPSULE | Freq: Two times a day (BID) | ORAL | 0 refills | Status: AC

## 2024-02-18 MED ORDER — LORATADINE 10 MG PO TABS: 10.0000 mg | ORAL_TABLET | Freq: Every day | ORAL | 11 refills | Status: AC

## 2024-02-18 NOTE — Assessment & Plan Note (Signed)
 Taking Keflex for a wound to left calf. Taking Keflex 500 mg BID for a total of 4.5 more days.

## 2024-02-18 NOTE — Progress Notes (Signed)
 BP 138/88   Pulse (!) 105   Temp 98 F (36.7 C)   Ht 5' 3 (1.6 m)   Wt 172 lb (78 kg)   SpO2 99%   BMI 30.47 kg/m    Subjective:    Patient ID: Tanner [name], male    DOB: Oct 26, 1950, 73 y.o.   MRN: [id number]  HPI: Onie JAYLEN KNOPE is a 73 y.o. male with a history of hypertension, post-traumatic osteoarthritis of left knee, and history of MRSA presenting today for a rash on the back and front of his neck that appeared back in September, was evaluated at that time and prescribed Kenalog  cream and hydroxyzine . He reports that the medication was working but now it has come back over the last week. He denies any wheezing or difficulty breathing. He endorses being outside this week cutting down a [place] plant and after that he noticed the rash got worse. He also endorses itchiness to his head.   He went to urgent care on Thursday after hitting left leg on a metal door. He was diagnosed with a staph infection of the left leg middle anterior calf and prescribed Keflex 500 mg twice a day. He has  1.5 more days left of the antibiotic. He has been doing wound care several times of day and applying triple antibiotic ointment to the area. He covers it with a guaze. Upon removing gauze he endorses mild bloody drainage. The swelling and redness has improved since taking the antibiotic. He denies any pain to left leg. No fever. He is able to move his leg without any limitations.   Images attached below... First image was from 3 days ago and second image is from today's visit.             01/06/2024   12:54 PM 11/06/2021    9:17 AM 03/21/2021    9:59 AM  Depression screen PHQ 2/9  Decreased Interest 0 0 0  Down, Depressed, Hopeless 0 0 0  PHQ - 2 Score 0 0 0  Altered sleeping  0   Tired, decreased energy  0   Change in appetite  0   Feeling bad or failure about yourself   0   Trouble concentrating  0   Moving slowly or fidgety/restless  0   Suicidal thoughts  0   PHQ-9 Score  0      Relevant past medical, surgical, family and social history reviewed and updated as indicated. Interim medical history since our last visit reviewed. Allergies and medications reviewed and updated.  Review of Systems Ten systems reviewed and is negative except as mentioned in HPI       Objective:     BP 138/88   Pulse (!) 105   Temp 98 F (36.7 C)   Ht 5' 3 (1.6 m)   Wt 172 lb (78 kg)   SpO2 99%   BMI 30.47 kg/m    Wt Readings from Last 3 Encounters:  02/18/24 172 lb (78 kg)  01/06/24 172 lb 8 oz (78.2 kg)  11/05/19 159 lb 8 oz (72.3 kg)    Physical Exam Constitutional:      Appearance: Normal appearance.  HENT:     Head: Normocephalic and atraumatic.  Cardiovascular:     Rate and Rhythm: Normal rate and regular rhythm.     Pulses: Normal pulses.     Heart sounds: Normal heart sounds.  Pulmonary:     Effort: Pulmonary effort is normal.  Breath sounds: Normal breath sounds.  Musculoskeletal:        General: Normal range of motion.     Cervical back: Normal range of motion and neck supple.  Skin:    General: Skin is warm and dry.     Findings: Erythema and wound present.     Comments: Erythema and wound noted to left anterior calf  Red rash to chest and upper back.    Neurological:     General: No focal deficit present.     Mental Status: He is alert and oriented to person, place, and time.  Psychiatric:        Mood and Affect: Mood normal.        Behavior: Behavior normal.        Thought Content: Thought content normal.        Judgment: Judgment normal.      Results for orders placed or performed in visit on 01/08/24  PTH, Intact and Calcium   Collection Time: 01/09/24 10:09 AM  Result Value Ref Range   PTH 92 (H) 16 - 77 pg/mL   Calcium 10.6 (H) 8.6 - 10.3 mg/dL          Assessment & Plan:   Problem List Items Addressed This Visit       Other   History of methicillin resistant staphylococcus aureus (MRSA)   Taking Keflex for a wound to  left calf. Taking Keflex 500 mg BID for a total of 4.5 more days.       Relevant Medications   cephALEXin (KEFLEX) 500 MG capsule   cephALEXin (KEFLEX) 500 MG capsule   Other Visit Diagnoses       Need for influenza vaccination    -  Primary     Rash       Begin taking claritin 10 mg and pepcid  20 mg. PRN hydroxyzine  sent in for severe itching   Relevant Medications   hydrOXYzine  (ATARAX ) 10 MG tablet   loratadine (CLARITIN) 10 MG tablet   famotidine  (PEPCID ) 20 MG tablet     Wound cellulitis       Patient is due to finish Keflex in 1.5 days. Sent in a 3 day rx of Keflex 500 mg BID to complete for when he finishes the first one. Continue w/ wound care   Relevant Medications   cephALEXin (KEFLEX) 500 MG capsule      -Due to warmth and fluctuance of wound, dicussed sending in three more days of Keflex 500 mg twice a day.Advised to continue with wound care at home and keeping area clean and dry. Recommended patient follow-up Monday to be sure it is continuing to heal. If no improvements consider sending in a different antibiotic or wound care.   -For rash, recommended taking a daily allergy medication. Prescription sent in of Claritin 10 mg daily. Advised to add in Pepcid  20 mg to help with itching. Begin taking these medications daily. PRN prescription sent in of Hydroxyzine  10 mg for severe itching. Advised to begin Claritin and Pepcid  first to see if these medications help, along with triamcinolone  (Kenalog ) cream which he has at home. Use the hydroxyzine  10 mg as needed in cases of severe itching. Keep skin clean and moisturized.         Follow up plan: Return if symptoms worsen or fail to improve.  I have reviewed this encounter including the documentation in this note and/or discussed this patient with the provider, Aislinn Womack, SNP, I am certifying that  I agree with the content of this note as supervising/preceptor nurse practitioner.  Mliss Spray, FNP-C [facility] 02/18/2024, 1:00 PM

## 2024-03-01 ENCOUNTER — Other Ambulatory Visit: Payer: Self-pay | Admitting: Nurse Practitioner

## 2024-03-01 ENCOUNTER — Ambulatory Visit: Payer: Self-pay

## 2024-03-01 DIAGNOSIS — L0390 Cellulitis, unspecified: Secondary | ICD-10-CM

## 2024-03-01 NOTE — Telephone Encounter (Signed)
 FYI Only or Action Required?: Action required by provider: request for appointment.  Patient was last seen in primary care on 02/18/2024 by Gareth Mliss FALCON, FNP.  Called Nurse Triage reporting Nasal Congestion and Cough.  Symptoms began 2 days ago.  Interventions attempted: Other: states he has been taking cough syrup that was previously prescribed.  Symptoms are: stable.  Triage Disposition: Home Care  Patient/caregiver understands and will follow disposition?: Yes                           Copied from CRM #[id number]. Topic: Clinical - Red Word Triage >> Mar 01, 2024  4:26 PM Joesph NOVAK wrote: Red Word that prompted transfer to Nurse Triage: Congestion, headaches, short of breath, wheezing, coughing. Reason for Disposition  Cough with cold symptoms (e.g., runny nose, postnasal drip, throat clearing)  Answer Assessment - Initial Assessment Questions 1. ONSET: When did the cough begin?      2 days 2. SEVERITY: How bad is the cough today?      Fairly frequent, states cough syrup helps cough 3. SPUTUM: Describe the color of your sputum (e.g., none, dry cough; clear, white, yellow, green)     Mostly clear 4. HEMOPTYSIS: Are you coughing up any blood? If Yes, ask: How much? (e.g., flecks, streaks, tablespoons, etc.)     Denies 5. DIFFICULTY BREATHING: Are you having difficulty breathing? If Yes, ask: How bad is it? (e.g., mild, moderate, severe)      Denies 6. FEVER: Do you have a fever? If Yes, ask: What is your temperature, how was it measured, and when did it start?     Denies 7. CARDIAC HISTORY: Do you have any history of heart disease? (e.g., heart attack, congestive heart failure)      Denies 8. LUNG HISTORY: Do you have any history of lung disease?  (e.g., pulmonary embolus, asthma, emphysema)     Denies 9. PE RISK FACTORS: Do you have a history of blood clots? (or: recent major surgery, recent prolonged travel, bedridden)      Denies 10. OTHER SYMPTOMS: Do you have any other symptoms? (e.g., runny nose, wheezing, chest pain)     Nasal congestion, sinus headache- denies redness and swelling along sinuses, denies sore throat, denies earache    Patient originally called in to cancel his appointment for this week on 03/03/24 because he is experiencing cold symptoms. Patient was transferred to this RN to reschedule appointment. No availability with provider until 03/23/24. Please follow-up with patient on scheduling, as this appointment was supposed to be a 6 week follow-up. Patient would like a call back.  Protocols used: Cough - Acute Productive-A-AH

## 2024-03-02 ENCOUNTER — Encounter: Payer: Self-pay | Admitting: Family Medicine

## 2024-03-02 ENCOUNTER — Ambulatory Visit (INDEPENDENT_AMBULATORY_CARE_PROVIDER_SITE_OTHER): Admitting: Family Medicine

## 2024-03-02 VITALS — BP 134/78 | HR 102 | Temp 98.1°F | Resp 16 | Ht 63.0 in | Wt 172.0 lb

## 2024-03-02 DIAGNOSIS — Z23 Encounter for immunization: Secondary | ICD-10-CM

## 2024-03-02 DIAGNOSIS — L729 Follicular cyst of the skin and subcutaneous tissue, unspecified: Secondary | ICD-10-CM

## 2024-03-02 DIAGNOSIS — J069 Acute upper respiratory infection, unspecified: Secondary | ICD-10-CM | POA: Diagnosis not present

## 2024-03-02 DIAGNOSIS — R21 Rash and other nonspecific skin eruption: Secondary | ICD-10-CM

## 2024-03-02 DIAGNOSIS — J302 Other seasonal allergic rhinitis: Secondary | ICD-10-CM

## 2024-03-02 MED ORDER — TRELEGY ELLIPTA 200-62.5-25 MCG/ACT IN AEPB: 1.0000 | INHALATION_SPRAY | Freq: Every day | RESPIRATORY_TRACT | Status: AC

## 2024-03-02 MED ORDER — FLUTICASONE PROPIONATE 50 MCG/ACT NA SUSP: 2.0000 | Freq: Every day | NASAL | 6 refills | Status: AC

## 2024-03-02 MED ORDER — TRELEGY ELLIPTA 100-62.5-25 MCG/ACT IN AEPB: 1.0000 | INHALATION_SPRAY | Freq: Every day | RESPIRATORY_TRACT | Status: DC

## 2024-03-02 MED ORDER — BENZONATATE 100 MG PO CAPS: 100.0000 mg | ORAL_CAPSULE | Freq: Three times a day (TID) | ORAL | 0 refills | Status: AC | PRN

## 2024-03-02 NOTE — Progress Notes (Unsigned)
 Name: Tanner [name]   MRN: [id number]    DOB: 1950/12/20   Date:03/02/2024       Progress Note  Subjective  Chief Complaint  Chief Complaint  Patient presents with   Sinusitis   Cough    X4 days, productive   Discussed the use of AI scribe software for clinical note transcription with the patient, who gave verbal consent to proceed.  History of Present Illness Tanner [name] is a 73 year old male who presents with a recurrent rash and upper respiratory symptoms.  He has been experiencing a recurrent rash for several months, which initially appeared in late August or early September. The rash is described as bumpy and migratory, currently located on the right side of his neck and previously on the back of his leg. There is no burning sensation associated with the rash. He has been applying triamcinolone  lotion regularly, although he did not apply it today to allow for examination. He has a history of psoriasis as a child.  He also has symptoms of an upper respiratory infection that began approximately five days ago, including congestion, rhinorrhea, and productive cough with yellow sputum, which initially started as clear. No fever or chills, but he experiences some facial pressure and occasional wheezing with deep breaths. He has been using a prescription nasal spray, Claritin, and a decongestant from a previous visit, which provide some relief. He has a history of seasonal allergies and has used an inhaler in the past, though not recently.  Additionally, he recently completed a course of antibiotics for cellulitis on his left lower leg, which was initially thought to be a cyst. The area is now less sore and red, with some drainage having occurred. He has been applying triple antibiotic ointment to the area.    Patient Active Problem List   Diagnosis Date Noted   Hypertension, benign 01/06/2024   Abscess of right lower extremity 11/05/2019   History of methicillin resistant staphylococcus  aureus (MRSA) 11/05/2019   Post-traumatic osteoarthritis of left knee 04/21/2018    Past Surgical History:  Procedure Laterality Date   INCISION AND DRAINAGE OF WOUND Left 2006   left thigh   IRRIGATION AND DEBRIDEMENT FOOT Right 02/24/2015   Procedure: IRRIGATION AND DEBRIDEMENT FOOT;  Surgeon: Eva Gay, DPM;  Location: [facility] ORS;  Service: Podiatry;  Laterality: Right;    Family History  Problem Relation Age of Onset   Diabetes Father     Social History   Tobacco Use   Smoking status: Never   Smokeless tobacco: Never  Substance Use Topics   Alcohol use: Yes    Alcohol/week: 0.0 standard drinks of alcohol    Comment: occassional     Current Outpatient Medications:    famotidine  (PEPCID ) 20 MG tablet, Take 1 tablet (20 mg total) by mouth 2 (two) times daily., Disp: 180 tablet, Rfl: 0   hydrOXYzine  (ATARAX ) 10 MG tablet, Take 1 tablet (10 mg total) by mouth 3 (three) times daily as needed., Disp: 30 tablet, Rfl: 0   loratadine (CLARITIN) 10 MG tablet, Take 1 tablet (10 mg total) by mouth daily., Disp: 30 tablet, Rfl: 11   Multiple Vitamins-Minerals (MENS MULTIVITAMIN PO), Take by mouth., Disp: , Rfl:    triamcinolone  cream (KENALOG ) 0.1 %, Apply 1 Application topically 2 (two) times daily., Disp: 453.6 g, Rfl: 0   cephALEXin (KEFLEX) 500 MG capsule, Take 500 mg by mouth every 12 (twelve) hours., Disp: , Rfl:    hydrOXYzine  (ATARAX ) 10 MG  tablet, Take 1 tablet (10 mg total) by mouth 3 (three) times daily as needed. (Patient not taking: Reported on 03/02/2024), Disp: 30 tablet, Rfl: 0  No Known Allergies  I personally reviewed active problem list, medication list, allergies, family history with the patient/caregiver today.   ROS  Ten systems reviewed and is negative except as mentioned in HPI    Objective Physical Exam CONSTITUTIONAL: Patient appears well-developed and well-nourished. No distress. HEENT: Head atraumatic, normocephalic, neck supple. Nose, throat, and  ears normal. CARDIOVASCULAR: Normal rate, regular rhythm and normal heart sounds. No murmur heard. No BLE edema. PULMONARY: Effort normal and breath sounds normal. Lungs clear to auscultation. No respiratory distress. ABDOMINAL: There is no tenderness or distention. MUSCULOSKELETAL: Normal gait. Without gross motor or sensory deficit. PSYCHIATRIC: Patient has a normal mood and affect. Behavior is normal. Judgment and thought content normal. SKIN: Rash on the right side of the neck, bumpy, no pain on palpation.      Vitals:   03/02/24 1354  BP: 134/78  Pulse: (!) 102  Resp: 16  Temp: 98.1 F (36.7 C)  SpO2: 99%  Weight: 172 lb (78 kg)  Height: 5' 3 (1.6 m)    Body mass index is 30.47 kg/m.  Recent Results (from the past 2160 hours)  C-reactive protein     Status: None   Collection Time: 01/06/24  1:31 PM  Result Value Ref Range   CRP 3.7 <8.0 mg/L  CBC with Differential/Platelet     Status: None   Collection Time: 01/06/24  1:31 PM  Result Value Ref Range   WBC 6.5 3.8 - 10.8 Thousand/uL   RBC 5.15 4.20 - 5.80 Million/uL   Hemoglobin 15.9 13.2 - 17.1 g/dL   HCT 52.2 61.4 - 49.9 %   MCV 92.6 80.0 - 100.0 fL   MCH 30.9 27.0 - 33.0 pg   MCHC 33.3 32.0 - 36.0 g/dL    Comment: For adults, a slight decrease in the calculated MCHC value (in the range of 30 to 32 g/dL) is most likely not clinically significant; however, it should be interpreted with caution in correlation with other red cell parameters and the patient's clinical condition.    RDW 12.2 11.0 - 15.0 %   Platelets 246 140 - 400 Thousand/uL   MPV 10.0 7.5 - 12.5 fL   Neutro Abs 4,219 1,500 - 7,800 cells/uL   Absolute Lymphocytes 1,391 850 - 3,900 cells/uL   Absolute Monocytes 592 200 - 950 cells/uL   Eosinophils Absolute 260 15 - 500 cells/uL   Basophils Absolute 39 0 - 200 cells/uL   Neutrophils Relative % 64.9 %   Total Lymphocyte 21.4 %   Monocytes Relative 9.1 %   Eosinophils Relative 4.0 %    Basophils Relative 0.6 %  Comprehensive metabolic panel with GFR     Status: Abnormal   Collection Time: 01/06/24  1:31 PM  Result Value Ref Range   Glucose, Bld 113 (H) 65 - 99 mg/dL    Comment: .            Fasting reference interval . For someone without known diabetes, a glucose value between 100 and 125 mg/dL is consistent with prediabetes and should be confirmed with a follow-up test. .    BUN 15 7 - 25 mg/dL   Creat 9.12 9.29 - 8.71 mg/dL   eGFR 92 > OR = 60 fO/fpw/8.26f7   BUN/Creatinine Ratio SEE NOTE: 6 - 22 (calc)    Comment:  Not Reported: BUN and Creatinine are within    reference range. .    Sodium 138 135 - 146 mmol/L   Potassium 4.4 3.5 - 5.3 mmol/L   Chloride 103 98 - 110 mmol/L   CO2 26 20 - 32 mmol/L   Calcium 10.8 (H) 8.6 - 10.3 mg/dL   Total Protein 7.7 6.1 - 8.1 g/dL   Albumin 4.8 3.6 - 5.1 g/dL   Globulin 2.9 1.9 - 3.7 g/dL (calc)   AG Ratio 1.7 1.0 - 2.5 (calc)   Total Bilirubin 0.5 0.2 - 1.2 mg/dL   Alkaline phosphatase (APISO) 71 35 - 144 U/L   AST 25 10 - 35 U/L   ALT 24 9 - 46 U/L  Sedimentation rate     Status: Abnormal   Collection Time: 01/06/24  1:31 PM  Result Value Ref Range   Sed Rate 22 (H) 0 - 20 mm/h  Hemoglobin A1c     Status: Abnormal   Collection Time: 01/06/24  1:31 PM  Result Value Ref Range   Hgb A1c MFr Bld 6.1 (H) <5.7 %    Comment: For someone without known diabetes, a hemoglobin  A1c value between 5.7% and 6.4% is consistent with prediabetes and should be confirmed with a  follow-up test. . For someone with known diabetes, a value <7% indicates that their diabetes is well controlled. A1c targets should be individualized based on duration of diabetes, age, comorbid conditions, and other considerations. . This assay result is consistent with an increased risk of diabetes. . Currently, no consensus exists regarding use of hemoglobin A1c for diagnosis of diabetes for children. .    Mean Plasma Glucose 128 mg/dL    eAG (mmol/L) 7.1 mmol/L  RPR     Status: None   Collection Time: 01/06/24  1:31 PM  Result Value Ref Range   RPR Ser Ql NON-REACTIVE NON-REACTIVE    Comment: . No laboratory evidence of syphilis. If recent exposure is suspected, submit a new sample in 2-4 weeks. .   Uric acid     Status: None   Collection Time: 01/06/24  1:31 PM  Result Value Ref Range   Uric Acid, Serum 7.3 4.0 - 8.0 mg/dL    Comment: Therapeutic target for gout patients: <6.0 mg/dL .   Hepatitis C Antibody     Status: None   Collection Time: 01/06/24  1:31 PM  Result Value Ref Range   Hepatitis C Ab NON-REACTIVE NON-REACTIVE    Comment: . HCV antibody was non-reactive. There is no laboratory  evidence of HCV infection. . In most cases, no further action is required. However, if recent HCV exposure is suspected, a test for HCV RNA (test code 64354) is suggested. . For additional information please refer to http://education.[facility].com/faq/FAQ22v1 (This link is being provided for informational/ educational purposes only.) .   PTH, Intact and Calcium     Status: Abnormal   Collection Time: 01/09/24 10:09 AM  Result Value Ref Range   PTH 92 (H) 16 - 77 pg/mL    Comment: . Interpretive Guide    Intact PTH           Calcium ------------------    ----------           ------- Normal Parathyroid    Normal               Normal Hypoparathyroidism    Low or Low Normal    Low Hyperparathyroidism    Primary  Normal or High       High    Secondary          High                 Normal or Low    Tertiary           High                 High Non-Parathyroid    Hypercalcemia      Low or Low Normal    High .    Calcium 10.6 (H) 8.6 - 10.3 mg/dL      EYV7/0:    0/0/7974   12:54 PM 11/06/2021    9:17 AM 03/21/2021    9:59 AM 11/05/2019    8:46 AM 07/13/2019   10:59 AM  Depression screen PHQ 2/9  Decreased Interest 0 0 0 0 0  Down, Depressed, Hopeless 0 0 0 0 0  PHQ - 2 Score 0 0 0 0 0   Altered sleeping  0  0 0  Tired, decreased energy  0  0 0  Change in appetite  0  0 0  Feeling bad or failure about yourself   0  0 0  Trouble concentrating  0  0 0  Moving slowly or fidgety/restless  0  0 0  Suicidal thoughts  0  0 0  PHQ-9 Score  0  0 0  Difficult doing work/chores    Not difficult at all Not difficult at all    phq 9 is negative  Fall Risk:    01/06/2024   12:54 PM 11/06/2021    9:17 AM 03/21/2021    9:58 AM 11/05/2019    8:46 AM 07/13/2019   10:51 AM  Fall Risk   Falls in the past year? 0 0 0 0 0   Number falls in past yr: 0 0 0 0 0  Injury with Fall? 0 0 0 0 0  Risk for fall due to : No Fall Risks No Fall Risks     Follow up Falls evaluation completed Falls prevention discussed  Falls evaluation completed        Data saved with a previous flowsheet row definition     Assessment & Plan Recurrent Rash Recurrent rash on neck and back of leg. Differential includes psoriasis and atypical shingles. Referral to dermatology needed. - Refer to dermatology for evaluation. - Advise placement on cancellation list for earlier appointment.  Left Lower Leg Lesion Lesion on left lower leg, initially treated as cellulitis, now appears cystic. Drained, less severe, no pain, some redness. May need drainage if unresolved. - Monitor for improvement or need for drainage.  Upper Respiratory Infection URI with congestion, runny nose, yellow nasal drainage, wheezing on deep breaths. No fever, no relief with current treatment. Trelegy inhaler provided, Tessalon  Perles prescribed. Antibiotics not indicated unless symptoms worsen. - Provide Trelegy inhaler sample. - Prescribe Tessalon  Perles for cough relief at night. - Advise continued use of saline spray, Flonase , and Claritin. - Instruct to monitor symptoms and report worsening or new symptoms, potentially requiring antibiotics.  General Health Maintenance Behind on vaccinations, specifically pneumonia vaccine. Safe to  administer today despite current illness. - Administer pneumonia vaccine today. - flu shot today since the flu vaccine he had a [facility] was in March  Follow-up Needs physical examination and dermatology follow-up. Dermatology appointments may take time, recommend cancellation list. - Schedule physical examination in approximately six weeks or when feeling  better. - Contact dermatologist and be available for cancellations to expedite appointment.

## 2024-03-02 NOTE — Telephone Encounter (Signed)
 Appt sch'd for 03/02/24

## 2024-03-03 ENCOUNTER — Ambulatory Visit: Admitting: Family Medicine

## 2024-03-03 NOTE — Telephone Encounter (Signed)
 Discontinued on 03/02/24.  Requested Prescriptions  Pending Prescriptions Disp Refills   cephALEXin (KEFLEX) 500 MG capsule [Pharmacy Med Name: Cephalexin 500 MG Oral Capsule] 6 capsule 0    Sig: TAKE 1 CAPSULE BY MOUTH TWICE DAILY FOR 3 DAYS     Off-Protocol Failed - 03/03/2024 10:41 AM      Failed - Medication not assigned to a protocol, review manually.      Passed - Valid encounter within last 12 months    Recent Outpatient Visits           Yesterday URI, acute   [facility] Glenard Mire, MD   2 weeks ago Need for influenza vaccination   [facility] Gareth Mliss FALCON, FNP   1 month ago Rash   [facility] Glenard Mire, MD

## 2024-03-16 ENCOUNTER — Ambulatory Visit (INDEPENDENT_AMBULATORY_CARE_PROVIDER_SITE_OTHER): Admitting: Dermatology

## 2024-03-16 ENCOUNTER — Encounter: Payer: Self-pay | Admitting: Dermatology

## 2024-03-16 DIAGNOSIS — D492 Neoplasm of unspecified behavior of bone, soft tissue, and skin: Secondary | ICD-10-CM

## 2024-03-16 DIAGNOSIS — W908XXA Exposure to other nonionizing radiation, initial encounter: Secondary | ICD-10-CM

## 2024-03-16 DIAGNOSIS — L209 Atopic dermatitis, unspecified: Secondary | ICD-10-CM | POA: Diagnosis not present

## 2024-03-16 DIAGNOSIS — L570 Actinic keratosis: Secondary | ICD-10-CM

## 2024-03-16 DIAGNOSIS — L578 Other skin changes due to chronic exposure to nonionizing radiation: Secondary | ICD-10-CM

## 2024-03-16 MED ORDER — TRIAMCINOLONE ACETONIDE 0.1 % EX CREA: 1.0000 | TOPICAL_CREAM | Freq: Two times a day (BID) | CUTANEOUS | 3 refills | Status: AC | PRN

## 2024-03-16 NOTE — Patient Instructions (Signed)

## 2024-03-16 NOTE — Progress Notes (Signed)
 New Patient Visit   Subjective  Tanner [name] is a 73 y.o. 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 Flaring at back, scalp, posterior legs, some flares at chest.   This patient is accompanied in the office by his spouse.  The following portions of the chart were reviewed this encounter and updated as appropriate: medications, allergies, medical history  Review of Systems:  No other skin or systemic complaints except as noted in HPI or Assessment and Plan.  Objective  Well appearing patient in no apparent distress; mood and affect are within normal limits.  A focused examination was performed of the following areas: Back, scalp, chest, face, right ear  Relevant exam findings are noted in the Assessment and Plan.     nasal tip Pink scaly macules  Assessment & Plan   ATOPIC DERMATITIS Exam: Scaly pink papules coalescing to plaques R upper back chest lower spinal back 5% BSA  Chronic and persistent condition with duration or expected duration over one year. Condition is bothersome/symptomatic for patient. Currently flared.  Atopic dermatitis (eczema) is a chronic, relapsing, pruritic condition that can significantly affect quality of life. It is often associated with allergic rhinitis and/or asthma and can require treatment with topical medications, phototherapy, or in severe cases biologic injectable medication (Dupixent; Adbry) or Oral JAK inhibitors.  Treatment Plan: Apply triamcinolone  0.1% cream twice a day as needed to affected areas. Avoid applying to face, groin, and axilla. Use as directed. Long-term use can cause thinning of the skin.  Topical steroids (such as triamcinolone , fluocinolone, fluocinonide, mometasone, clobetasol, halobetasol, betamethasone, hydrocortisone) can cause thinning and  lightening of the skin if they are used for too long in the same area. Your physician has selected the right strength medicine for your problem and area affected on the body. Please use your medication only as directed by your physician to prevent side effects.    Recommend gentle skin care.  ACTINIC DAMAGE - chronic, secondary to cumulative UV radiation exposure/sun exposure over time - diffuse scaly erythematous macules with underlying dyspigmentation - Recommend daily broad spectrum sunscreen SPF 30+ to sun-exposed areas, reapply every 2 hours as needed.  - Recommend staying in the shade or wearing long sleeves, sun glasses (UVA+UVB protection) and wide brim hats (4-inch brim around the entire circumference of the hat). - Call for new or changing lesions.   NEOPLASM OF SKIN Right Ear Advised exam most concerning for B[facility].   Patient has upcoming trip for his anniversary, would prefer to wait until after his trip for biopsy.  AK (ACTINIC KERATOSIS) nasal tip Discussed treatment with cryotherapy. Patient prefers to treat after he return from his trip.    Actinic keratoses are precancerous spots that appear secondary to cumulative UV radiation exposure/sun exposure over time. They are chronic with expected duration over 1 year. A portion of actinic keratoses will progress to squamous cell carcinoma of the skin. It is not possible to reliably predict which spots will progress to skin cancer and so treatment is recommended to prevent development of skin cancer.  Recommend daily broad spectrum sunscreen SPF 30+ to sun-exposed areas, reapply every 2 hours as needed.  Recommend staying in the shade or wearing long sleeves, sun glasses (UVA+UVB protection) and wide brim hats (4-inch brim around the entire circumference of the hat). Call for  new or changing lesions. ATOPIC DERMATITIS, UNSPECIFIED TYPE   ACTINIC ELASTOSIS    Return in about 1 month (around 04/15/2024) for right ear biopsy,  w/ Dr. Claudene.  I, Jacquelynn V. Wilfred, CMA, am acting as scribe for Boneta Claudene, MD.  Documentation: I have reviewed the above documentation for accuracy and completeness, and I agree with the above.  Boneta Claudene, MD

## 2024-04-13 ENCOUNTER — Encounter: Payer: Self-pay | Admitting: Dermatology

## 2024-04-13 ENCOUNTER — Ambulatory Visit: Admitting: Dermatology

## 2024-04-13 DIAGNOSIS — D492 Neoplasm of unspecified behavior of bone, soft tissue, and skin: Secondary | ICD-10-CM

## 2024-04-13 DIAGNOSIS — L570 Actinic keratosis: Secondary | ICD-10-CM

## 2024-04-13 DIAGNOSIS — R21 Rash and other nonspecific skin eruption: Secondary | ICD-10-CM

## 2024-04-13 DIAGNOSIS — L239 Allergic contact dermatitis, unspecified cause: Secondary | ICD-10-CM | POA: Diagnosis not present

## 2024-04-13 MED ORDER — TACROLIMUS 0.1 % EX OINT: TOPICAL_OINTMENT | CUTANEOUS | 0 refills | Status: AC

## 2024-04-13 MED ORDER — CLOBETASOL PROPIONATE 0.05 % EX CREA: TOPICAL_CREAM | CUTANEOUS | 2 refills | Status: AC

## 2024-04-13 NOTE — Patient Instructions (Addendum)

## 2024-04-13 NOTE — Progress Notes (Signed)
 Follow-Up Visit   Subjective  Tanner [name] is a 73 y.o. male who presents for the following: Patient here for biopsy at right ear and treatments of AK at nasal tip.  Patient would like to wait for the biopsy on right ear and cryo on nasal tip. Patient would like to get checked for a rash on back, neck, and lower legs. Has been present since late August and has been using triamcinolone  cream and works temporarily.    The following portions of the chart were reviewed this encounter and updated as appropriate: medications, allergies, medical history  Review of Systems:  No other skin or systemic complaints except as noted in HPI or Assessment and Plan.  Objective  Well appearing patient in no apparent distress; mood and affect are within normal limits.  A focused examination was performed of the following areas: Upper and lower extremities, trunk,   Relevant exam findings are noted in the Assessment and Plan.         Left flank Pink edematous scaly papules coalescing to plaques diffusely on trunk and extremities   nasal tip Pink scaly macules  Assessment & Plan   RASH AND OTHER NONSPECIFIC SKIN ERUPTION Left flank Start clobetasol  0.05% cream twice a day as needed to affected areas for up to 2 weeks. Avoid applying to face, groin, and axilla. Use as directed. Long-term use can cause thinning of the skin.  Start tacrolimus  0.1% ointment twice a day as needed to affected areas   Topical steroids (such as triamcinolone , fluocinolone, fluocinonide, mometasone, clobetasol , halobetasol, betamethasone, hydrocortisone) can cause thinning and lightening of the skin if they are used for too long in the same area. Your physician has selected the right strength medicine for your problem and area affected on the body. Please use your medication only as directed by your physician to prevent side effects.   - Skin / nail biopsy - Left flank Type of biopsy: tangential   Informed  consent: discussed and consent obtained   Timeout: patient name, date of birth, surgical site, and procedure verified   Procedure prep:  Patient was prepped and draped in usual sterile fashion Prep type:  Isopropyl alcohol Anesthesia: the lesion was anesthetized in a standard fashion   Anesthetic:  1% lidocaine  w/ epinephrine  1-100,000 buffered w/ 8.4% NaHCO3 Instrument used: DermaBlade   Hemostasis achieved with: pressure and aluminum chloride   Outcome: patient tolerated procedure well   Post-procedure details: sterile dressing applied and wound care instructions given   Dressing type: bandage and petrolatum    Specimen 1 - Surgical pathology Differential Diagnosis: atopic vs urticaria vs drug eruption   Check Margins: No Left flank NEOPLASM OF SKIN Right Ear Patient would like to proceed with biopsy after the holidays are over.  AK (ACTINIC KERATOSIS) nasal tip Patient would like to treat with cryotherapy after the holidays are over.   Actinic keratoses are precancerous spots that appear secondary to cumulative UV radiation exposure/sun exposure over time. They are chronic with expected duration over 1 year. A portion of actinic keratoses will progress to squamous cell carcinoma of the skin. It is not possible to reliably predict which spots will progress to skin cancer and so treatment is recommended to prevent development of skin cancer.  Recommend daily broad spectrum sunscreen SPF 30+ to sun-exposed areas, reapply every 2 hours as needed.  Recommend staying in the shade or wearing long sleeves, sun glasses (UVA+UVB protection) and wide brim hats (4-inch brim around the entire  circumference of the hat). Call for new or changing lesions.  Return for Pending biopsy results, biopsy right ear w/ Dr. Claudene.  IAlmetta Nora, RMA, am acting as scribe for Boneta Claudene, MD .   Documentation: I have reviewed the above documentation for accuracy and completeness, and I agree  with the above.  Boneta Claudene, MD

## 2024-04-14 LAB — SURGICAL PATHOLOGY

## 2024-04-15 ENCOUNTER — Telehealth: Payer: Self-pay

## 2024-04-15 NOTE — Telephone Encounter (Signed)
 Patient called because he had seen his biopsy results in mychart. Explained that even though he is able to see results in mychart we are still waiting for Dr. Claudene finalization on the report.  Explained once we have his final comments and recommendations will call.

## 2024-04-20 NOTE — Patient Instructions (Incomplete)
 Preventive Care 73 Years and Older, Male Preventive care refers to lifestyle choices and visits with your health care provider that can promote health and wellness. Preventive care visits are also called wellness exams. What can I expect for my preventive care visit? Counseling During your preventive care visit, your health care provider may ask about your: Medical history, including: Past medical problems. Family medical history. History of falls. Current health, including: Emotional well-being. Home life and relationship well-being. Sexual activity. Memory and ability to understand (cognition). Lifestyle, including: Alcohol, nicotine or tobacco, and drug use. Access to firearms. Diet, exercise, and sleep habits. Work and work Astronomer. Sunscreen use. Safety issues such as seatbelt and bike helmet use. Physical exam Your health care provider will check your: Height and weight. These may be used to calculate your BMI (body mass index). BMI is a measurement that tells if you are at a healthy weight. Waist circumference. This measures the distance around your waistline. This measurement also tells if you are at a healthy weight and may help predict your risk of certain diseases, such as type 2 diabetes and high blood pressure. Heart rate and blood pressure. Body temperature. Skin for abnormal spots. What immunizations do I need?  Vaccines are usually given at various ages, according to a schedule. Your health care provider will recommend vaccines for you based on your age, medical history, and lifestyle or other factors, such as travel or where you work. What tests do I need? Screening Your health care provider may recommend screening tests for certain conditions. This may include: Lipid and cholesterol levels. Diabetes screening. This is done by checking your blood sugar (glucose) after you have not eaten for a while (fasting). Hepatitis C test. Hepatitis B test. HIV (human  immunodeficiency virus) test. STI (sexually transmitted infection) testing, if you are at risk. Lung cancer screening. Colorectal cancer screening. Prostate cancer screening. Abdominal aortic aneurysm (AAA) screening. You may need this if you are a current or former smoker. Talk with your health care provider about your test results, treatment options, and if necessary, the need for more tests. Follow these instructions at home: Eating and drinking  Eat a diet that includes fresh fruits and vegetables, whole grains, lean protein, and low-fat dairy products. Limit your intake of foods with high amounts of sugar, saturated fats, and salt. Take vitamin and mineral supplements as recommended by your health care provider. Do not drink alcohol if your health care provider tells you not to drink. If you drink alcohol: Limit how much you have to 0-2 drinks a day. Know how much alcohol is in your drink. In the [place] one drink equals one 12 oz bottle of beer (355 mL), one 5 oz glass of wine (148 mL), or one 1 oz glass of hard liquor (44 mL). Lifestyle Brush your teeth every morning and night with fluoride toothpaste. Floss one time each day. Exercise for at least 30 minutes 5 or more days each week. Do not use any products that contain nicotine or tobacco. These products include cigarettes, chewing tobacco, and vaping devices, such as e-cigarettes. If you need help quitting, ask your health care provider. Do not use drugs. If you are sexually active, practice safe sex. Use a condom or other form of protection to prevent STIs. Take aspirin only as told by your health care provider. Make sure that you understand how much to take and what form to take. Work with your health care provider to find out whether it is safe  and beneficial for you to take aspirin daily. Ask your health care provider if you need to take a cholesterol-lowering medicine (statin). Find healthy ways to manage stress, such  as: Meditation, yoga, or listening to music. Journaling. Talking to a trusted person. Spending time with friends and family. Safety Always wear your seat belt while driving or riding in a vehicle. Do not drive: If you have been drinking alcohol. Do not ride with someone who has been drinking. When you are tired or distracted. While texting. If you have been using any mind-altering substances or drugs. Wear a helmet and other protective equipment during sports activities. If you have firearms in your house, make sure you follow all gun safety procedures. Minimize exposure to UV radiation to reduce your risk of skin cancer. What's next? Visit your health care provider once a year for an annual wellness visit. Ask your health care provider how often you should have your eyes and teeth checked. Stay up to date on all vaccines. This information is not intended to replace advice given to you by your health care provider. Make sure you discuss any questions you have with your health care provider. Document Revised: 10/11/2020 Document Reviewed: 10/11/2020 Elsevier Patient Education  2024 [employer].

## 2024-04-26 ENCOUNTER — Encounter: Admitting: Family Medicine

## 2024-05-11 ENCOUNTER — Ambulatory Visit: Admitting: Dermatology

## 2024-07-21 ENCOUNTER — Ambulatory Visit: Admitting: "Endocrinology
